# Patient Record
Sex: Female | Born: 1955 | Race: Black or African American | Hispanic: No | Marital: Married | Smoking: Current every day smoker
Health system: Southern US, Community
[De-identification: ages and names within clinical notes are randomized; demographics above are authoritative.]

## PROBLEM LIST (undated history)

## (undated) DIAGNOSIS — I10 Essential (primary) hypertension: Secondary | ICD-10-CM

## (undated) DIAGNOSIS — K219 Gastro-esophageal reflux disease without esophagitis: Secondary | ICD-10-CM

## (undated) DIAGNOSIS — G8929 Other chronic pain: Secondary | ICD-10-CM

## (undated) DIAGNOSIS — E7800 Pure hypercholesterolemia, unspecified: Secondary | ICD-10-CM

## (undated) HISTORY — PX: ABDOMINAL HYSTERECTOMY: SHX81

## (undated) HISTORY — PX: WRIST SURGERY: SHX841

---

## 2012-01-02 ENCOUNTER — Emergency Department (HOSPITAL_BASED_OUTPATIENT_CLINIC_OR_DEPARTMENT_OTHER): Payer: Medicare PPO

## 2012-01-02 ENCOUNTER — Emergency Department (HOSPITAL_BASED_OUTPATIENT_CLINIC_OR_DEPARTMENT_OTHER)
Admission: EM | Admit: 2012-01-02 | Discharge: 2012-01-02 | Disposition: A | Discharge status: Home / Self Care | Payer: Medicare PPO | Attending: Emergency Medicine | Admitting: Emergency Medicine

## 2012-01-02 ENCOUNTER — Encounter (HOSPITAL_BASED_OUTPATIENT_CLINIC_OR_DEPARTMENT_OTHER): Payer: Self-pay | Admitting: *Deleted

## 2012-01-02 DIAGNOSIS — IMO0001 Reserved for inherently not codable concepts without codable children: Secondary | ICD-10-CM | POA: Insufficient documentation

## 2012-01-02 DIAGNOSIS — I10 Essential (primary) hypertension: Secondary | ICD-10-CM | POA: Insufficient documentation

## 2012-01-02 DIAGNOSIS — E7800 Pure hypercholesterolemia, unspecified: Secondary | ICD-10-CM | POA: Insufficient documentation

## 2012-01-02 DIAGNOSIS — M6580 Other synovitis and tenosynovitis, unspecified site: Secondary | ICD-10-CM | POA: Insufficient documentation

## 2012-01-02 DIAGNOSIS — F17200 Nicotine dependence, unspecified, uncomplicated: Secondary | ICD-10-CM | POA: Insufficient documentation

## 2012-01-02 DIAGNOSIS — M778 Other enthesopathies, not elsewhere classified: Secondary | ICD-10-CM

## 2012-01-02 HISTORY — DX: Essential (primary) hypertension: I10

## 2012-01-02 HISTORY — DX: Pure hypercholesterolemia, unspecified: E78.00

## 2012-01-02 MED ORDER — HYDROCODONE-ACETAMINOPHEN 5-325 MG PO TABS: 2.0000 | ORAL_TABLET | ORAL | Status: DC | PRN

## 2012-01-02 MED ORDER — HYDROCODONE-ACETAMINOPHEN 5-325 MG PO TABS
2.0000 | ORAL_TABLET | Freq: Once | ORAL | Status: AC
  Administered 2012-01-02: 2 via ORAL
  Filled 2012-01-02: qty 2

## 2012-01-02 NOTE — ED Notes (Signed)
Right wrist and shoulder pain x 3 days.  She has been taking Arthritis Tylenol without relief.  No injury.

## 2012-01-02 NOTE — ED Provider Notes (Signed)
History     CSN: [id number]  Arrival date & time 01/02/12  1924   First MD Initiated Contact with Patient 01/02/12 1947      Chief Complaint  Patient presents with  . Wrist Pain    (Consider location/radiation/quality/duration/timing/severity/associated sxs/prior treatment) Patient is a 56 y.o. female presenting with wrist pain. The history is provided by the patient. No language interpreter was used.  Wrist Pain This is a new problem. Episode onset: 3 days. The problem occurs constantly. The problem has been gradually worsening. Associated symptoms include myalgias. Pertinent negatives include no joint swelling. Associated symptoms comments: Elbow pain that shots to shoulder and wrist. The symptoms are aggravated by bending. She has tried acetaminophen for the symptoms. The treatment provided no relief.   Pt denies any injury.   Pt complains of pain with moving Past Medical History  Diagnosis Date  . Hypertension   . High cholesterol     Past Surgical History  Procedure Date  . Wrist surgery     No family history on file.  History  Substance Use Topics  . Smoking status: Current Every Day Smoker -- 0.5 packs/day    Types: Cigarettes  . Smokeless tobacco: Not on file  . Alcohol Use: No    OB History    Grav Para Term Preterm Abortions TAB SAB Ect Mult Living                  Review of Systems  Musculoskeletal: Positive for myalgias. Negative for joint swelling.  All other systems reviewed and are negative.    Allergies  Review of patient's allergies indicates no known allergies.  Home Medications   Current Outpatient Rx  Name  Route  Sig  Dispense  Refill  . GABAPENTIN PO   Oral   Take by mouth.         Marland Kitchen HYDROCODONE-ACETAMINOPHEN 5-325 MG PO TABS   Oral   Take 1 tablet by mouth every 6 (six) hours as needed.         Marland Kitchen SIMVASTATIN 40 MG PO TABS   Oral   Take 40 mg by mouth every evening.           BP 138/66  Pulse 80  Temp 98.6 F (37  C) (Oral)  Resp 20  SpO2 99%  Physical Exam  Nursing note and vitals reviewed. Constitutional: She is oriented to person, place, and time. She appears well-developed and well-nourished.  Cardiovascular: Normal rate.   Pulmonary/Chest: Effort normal.  Musculoskeletal: She exhibits tenderness.       Pain with range of motion right elbow, right shoulder and right wrist  Neurological: She is alert and oriented to person, place, and time. She has normal reflexes.  Skin: Skin is warm.  Psychiatric: She has a normal mood and affect.    ED Course  Procedures (including critical care time)  Labs Reviewed - No data to display No results found.   1. Tendonitis of elbow, right       MDM   Followup with Dr. Pearletha Forge for recheck in one week patient is given prescription for hydrocodone for pain she is advised to limit use of right elbow patient is placed in a sling.       Lonia Skinner [place], [place] 01/02/12 2105

## 2012-01-03 NOTE — ED Provider Notes (Signed)
Medical screening examination/treatment/procedure(s) were performed by non-physician practitioner and as supervising physician I was immediately available for consultation/collaboration.   Glynn Octave, MD 01/03/12 [phone]

## 2012-05-08 ENCOUNTER — Encounter (HOSPITAL_BASED_OUTPATIENT_CLINIC_OR_DEPARTMENT_OTHER): Payer: Self-pay | Admitting: *Deleted

## 2012-05-08 ENCOUNTER — Emergency Department (HOSPITAL_BASED_OUTPATIENT_CLINIC_OR_DEPARTMENT_OTHER)
Admission: EM | Admit: 2012-05-08 | Discharge: 2012-05-08 | Disposition: A | Discharge status: Home / Self Care | Payer: Medicare PPO | Attending: Emergency Medicine | Admitting: Emergency Medicine

## 2012-05-08 DIAGNOSIS — Z79899 Other long term (current) drug therapy: Secondary | ICD-10-CM | POA: Insufficient documentation

## 2012-05-08 DIAGNOSIS — E7800 Pure hypercholesterolemia, unspecified: Secondary | ICD-10-CM | POA: Insufficient documentation

## 2012-05-08 DIAGNOSIS — F17200 Nicotine dependence, unspecified, uncomplicated: Secondary | ICD-10-CM | POA: Insufficient documentation

## 2012-05-08 DIAGNOSIS — L0390 Cellulitis, unspecified: Secondary | ICD-10-CM

## 2012-05-08 DIAGNOSIS — I10 Essential (primary) hypertension: Secondary | ICD-10-CM | POA: Insufficient documentation

## 2012-05-08 DIAGNOSIS — N63 Unspecified lump in unspecified breast: Secondary | ICD-10-CM | POA: Insufficient documentation

## 2012-05-08 DIAGNOSIS — B372 Candidiasis of skin and nail: Secondary | ICD-10-CM | POA: Insufficient documentation

## 2012-05-08 MED ORDER — CEPHALEXIN 500 MG PO CAPS: 500.0000 mg | ORAL_CAPSULE | Freq: Four times a day (QID) | ORAL | Status: DC

## 2012-05-08 MED ORDER — NYSTATIN 100000 UNIT/GM EX POWD: Freq: Four times a day (QID) | CUTANEOUS | Status: DC

## 2012-05-08 NOTE — ED Provider Notes (Signed)
History     CSN: [id number]  Arrival date & time 05/08/12  1947   First MD Initiated Contact with Patient 05/08/12 2125      Chief Complaint  Patient presents with  . Rash    (Consider location/radiation/quality/duration/timing/severity/associated sxs/prior treatment) Patient is a 57 y.o. female presenting with rash. The history is provided by the patient.  Rash Location: under breasts and right thigh. Quality: itchiness and swelling   Severity:  Moderate Onset quality:  Gradual Duration:  2 days Timing:  Constant Progression:  Worsening Chronicity:  New   Past Medical History  Diagnosis Date  . Hypertension   . High cholesterol     Past Surgical History  Procedure Laterality Date  . Wrist surgery      No family history on file.  History  Substance Use Topics  . Smoking status: Current Every Day Smoker -- 0.50 packs/day    Types: Cigarettes  . Smokeless tobacco: Not on file  . Alcohol Use: No    OB History   Grav Para Term Preterm Abortions TAB SAB Ect Mult Living                  Review of Systems  Skin: Positive for rash.  All other systems reviewed and are negative.    Allergies  Review of patient's allergies indicates no known allergies.  Home Medications   Current Outpatient Rx  Name  Route  Sig  Dispense  Refill  . GABAPENTIN PO   Oral   Take by mouth.         Marland Kitchen HYDROcodone-acetaminophen (NORCO/VICODIN) 5-325 MG per tablet   Oral   Take 1 tablet by mouth every 6 (six) hours as needed.         Marland Kitchen HYDROcodone-acetaminophen (NORCO/VICODIN) 5-325 MG per tablet   Oral   Take 2 tablets by mouth every 4 (four) hours as needed for pain.   10 tablet   0   . simvastatin (ZOCOR) 40 MG tablet   Oral   Take 40 mg by mouth every evening.           BP 131/78  Pulse 100  Temp(Src) 98.2 F (36.8 C) (Oral)  Resp 20  Wt 159 lb (72.122 kg)  SpO2 98%  Physical Exam  Nursing note and vitals reviewed. Constitutional: She is oriented  to person, place, and time. She appears well-developed and well-nourished. No distress.  HENT:  Head: Normocephalic and atraumatic.  Mouth/Throat: Oropharynx is clear and moist.  Neck: Normal range of motion. Neck supple.  Neurological: She is alert and oriented to person, place, and time.  Skin: Skin is warm and dry. She is not diaphoretic.  There is what appears to be a candidal infection under the breasts.  There is also a larger 10 cm round area to the right lateral thigh that is erythematous, warm to the touch.      ED Course  Procedures (including critical care time)  Labs Reviewed - No data to display No results found.   No diagnosis found.    MDM  The breast rash appears to be candidal in nature but the thigh looks like cellulitis.  Will treat the former with nystatin and the latter with keflex.  To return prn.        Geoffery Lyons, MD 05/08/12 2136

## 2012-05-08 NOTE — ED Notes (Addendum)
Blistery rash under both breast and large swollen, painful area to her right thigh x 2 weeks. No relief with Benadryl. Started after sitting on a tree her sister had just cut down.

## 2013-11-24 ENCOUNTER — Emergency Department (HOSPITAL_BASED_OUTPATIENT_CLINIC_OR_DEPARTMENT_OTHER)
Admission: EM | Admit: 2013-11-24 | Discharge: 2013-11-24 | Disposition: A | Discharge status: Home / Self Care | Payer: Medicare HMO | Attending: Emergency Medicine | Admitting: Emergency Medicine

## 2013-11-24 ENCOUNTER — Emergency Department (HOSPITAL_BASED_OUTPATIENT_CLINIC_OR_DEPARTMENT_OTHER): Payer: Medicare HMO

## 2013-11-24 ENCOUNTER — Encounter (HOSPITAL_BASED_OUTPATIENT_CLINIC_OR_DEPARTMENT_OTHER): Payer: Self-pay | Admitting: Emergency Medicine

## 2013-11-24 DIAGNOSIS — S8992XA Unspecified injury of left lower leg, initial encounter: Secondary | ICD-10-CM | POA: Diagnosis present

## 2013-11-24 DIAGNOSIS — M25562 Pain in left knee: Secondary | ICD-10-CM

## 2013-11-24 DIAGNOSIS — Y9289 Other specified places as the place of occurrence of the external cause: Secondary | ICD-10-CM | POA: Diagnosis not present

## 2013-11-24 DIAGNOSIS — E780 Pure hypercholesterolemia: Secondary | ICD-10-CM | POA: Insufficient documentation

## 2013-11-24 DIAGNOSIS — Z720 Tobacco use: Secondary | ICD-10-CM | POA: Insufficient documentation

## 2013-11-24 DIAGNOSIS — W010XXA Fall on same level from slipping, tripping and stumbling without subsequent striking against object, initial encounter: Secondary | ICD-10-CM | POA: Insufficient documentation

## 2013-11-24 DIAGNOSIS — S59902A Unspecified injury of left elbow, initial encounter: Secondary | ICD-10-CM | POA: Diagnosis not present

## 2013-11-24 DIAGNOSIS — Y9389 Activity, other specified: Secondary | ICD-10-CM | POA: Insufficient documentation

## 2013-11-24 DIAGNOSIS — Z792 Long term (current) use of antibiotics: Secondary | ICD-10-CM | POA: Diagnosis not present

## 2013-11-24 DIAGNOSIS — Z79899 Other long term (current) drug therapy: Secondary | ICD-10-CM | POA: Diagnosis not present

## 2013-11-24 DIAGNOSIS — M25522 Pain in left elbow: Secondary | ICD-10-CM

## 2013-11-24 DIAGNOSIS — I10 Essential (primary) hypertension: Secondary | ICD-10-CM | POA: Insufficient documentation

## 2013-11-24 MED ORDER — IBUPROFEN 800 MG PO TABS
800.0000 mg | ORAL_TABLET | Freq: Once | ORAL | Status: AC
  Administered 2013-11-24: 800 mg via ORAL
  Filled 2013-11-24: qty 1

## 2013-11-24 MED ORDER — IBUPROFEN 800 MG PO TABS: 800.0000 mg | ORAL_TABLET | Freq: Three times a day (TID) | ORAL | Status: DC

## 2013-11-24 NOTE — ED Notes (Signed)
Patient fell appx an hour ago on cement floor, now c/o left knee pain and left elbow pain

## 2013-11-24 NOTE — ED Provider Notes (Signed)
CSN: [id number]     Arrival date & time 11/24/13  1737 History   First MD Initiated Contact with Patient 11/24/13 1753     Chief Complaint  Patient presents with  . Fall     (Consider location/radiation/quality/duration/timing/severity/associated sxs/prior Treatment) HPI Comments: This is a 58 year old female who presents to the emergency department complaining of left knee pain and left elbow pain after a mechanical fall occurring about 2 hours prior to arrival. Patient reports the cement floor was uneven causing her to trip and fall, she reports she tried to break her fall with her elbow and knee. No head injury or loss of consciousness. States her knee pain is worse than her elbow pain, worse with certain movements. She has not had any alleviating factors. Denies any radiating pain. No numbness or tingling.  Patient is a 58 y.o. female presenting with fall. The history is provided by the patient and a relative.  Fall    Past Medical History  Diagnosis Date  . Hypertension   . High cholesterol    Past Surgical History  Procedure Laterality Date  . Wrist surgery     No family history on file. History  Substance Use Topics  . Smoking status: Current Every Day Smoker -- 0.50 packs/day    Types: Cigarettes  . Smokeless tobacco: Not on file  . Alcohol Use: No   OB History   Grav Para Term Preterm Abortions TAB SAB Ect Mult Living                 Review of Systems  10 Systems reviewed and are negative for acute change except as noted in the HPI.   Allergies  Review of patient's allergies indicates no known allergies.  Home Medications   Prior to Admission medications   Medication Sig Start Date End Date Taking? Authorizing Provider  losartan-hydrochlorothiazide (HYZAAR) 100-12.5 MG per tablet Take 1 tablet by mouth daily.   Yes Historical Provider, MD  cephALEXin (KEFLEX) 500 MG capsule Take 1 capsule (500 mg total) by mouth 4 (four) times daily. 05/08/12   Geoffery Lyonsouglas  Delo, MD  GABAPENTIN PO Take by mouth.    Historical Provider, MD  HYDROcodone-acetaminophen (NORCO/VICODIN) 5-325 MG per tablet Take 1 tablet by mouth every 6 (six) hours as needed.    Historical Provider, MD  HYDROcodone-acetaminophen (NORCO/VICODIN) 5-325 MG per tablet Take 2 tablets by mouth every 4 (four) hours as needed for pain. 01/02/12   Elson AreasLeslie K Sofia, PA-C  ibuprofen (ADVIL,MOTRIN) 800 MG tablet Take 1 tablet (800 mg total) by mouth 3 (three) times daily. 11/24/13   Dejanae Helser M Jahmya Onofrio, PA-C  nystatin (MYCOSTATIN) powder Apply topically 4 (four) times daily. 05/08/12   Geoffery Lyonsouglas Delo, MD  simvastatin (ZOCOR) 40 MG tablet Take 40 mg by mouth every evening.    Historical Provider, MD   BP 156/90  Pulse 69  Temp(Src) 98.3 F (36.8 C) (Oral)  Resp 18  Ht 5' (1.524 m)  Wt 155 lb (70.308 kg)  BMI 30.27 kg/m2  SpO2 95% Physical Exam  Nursing note and vitals reviewed. Constitutional: She is oriented to person, place, and time. She appears well-developed and well-nourished. No distress.  HENT:  Head: Normocephalic and atraumatic.  Mouth/Throat: Oropharynx is clear and moist.  Eyes: Conjunctivae and EOM are normal.  Neck: Normal range of motion. Neck supple.  Cardiovascular: Normal rate, regular rhythm, normal heart sounds and intact distal pulses.   +2 DP/PT pulse on left. +2 radial pulse on left. Refill less  than 3 seconds.  Pulmonary/Chest: Effort normal and breath sounds normal. No respiratory distress.  Musculoskeletal: Normal range of motion. She exhibits no edema.  L knee TTP over patella. No swelling, bruising or deformity. FROM. L elbow TTP over medial epicondyle. No swelling, bruising or deformity. Full flexion, extension, supination and pronation.  Neurological: She is alert and oriented to person, place, and time. No sensory deficit.  Sensation intact.  Skin: Skin is warm and dry.  Psychiatric: She has a normal mood and affect. Her behavior is normal.    ED Course  Procedures  (including critical care time) Labs Review Labs Reviewed - No data to display  Imaging Review Dg Elbow Complete Left  11/24/2013   CLINICAL DATA:  58 year old female with left elbow pain  EXAM: LEFT ELBOW - COMPLETE 3+ VIEW  COMPARISON:  None.  FINDINGS: No acute bony abnormality, although pronated view demonstrates slight angulation at the radial head on the ulnar aspect. No significant soft tissue swelling. No radiopaque foreign body. No joint effusion. Elbow joint is congruent.  IMPRESSION: No acute displaced fracture identified, however, there is angulation on the ulnar aspect at the radial head, which could potentially represent a nondisplaced fracture. If there is point tenderness and clinical concern for acute fracture, repeat plain films in 10-14 days may be useful.  Signed,  Yvone NeuJaime S. Loreta AveWagner, DO  [facility]  [facility]   Electronically Signed   By: Gilmer MorJaime  Wagner D.O.   On: 11/24/2013 18:47   Dg Knee Complete 4 Views Left  11/24/2013   CLINICAL DATA:  58 year old female status post fall onto cement floor with acute knee pain. Initial encounter.  EXAM: LEFT KNEE - COMPLETE 4+ VIEW  COMPARISON:  None.  FINDINGS: Bone mineralization is within normal limits. No joint effusion identified. Patella intact. Mild medial compartment joint space loss and degenerative spurring. No acute fracture or dislocation identified. Calcified atherosclerosis intermittently noted in the visible left lower extremity.  IMPRESSION: No acute fracture or dislocation identified about the left knee.   Electronically Signed   By: Augusto GambleLee  Hall M.D.   On: 11/24/2013 18:44     EKG Interpretation None      MDM   Final diagnoses:  Fall from slip, trip, or stumble, initial encounter  Left knee pain  Left elbow pain   Patient presenting after a mechanical fall. She is nontoxic appearing and in no apparent distress. Neurovascularly intact. Left knee x-ray normal. Left elbow  x-ray with results as shown above. After receiving ibuprofen, she is moving her elbow around without any pain. Doubt fracture. Sling given. Ace wrap to knee. Advised her to followup with her PCP in one week for recheck.  RICE, NSAIDs. Stable for d/c. Ambulates without difficulty. Return precautions given. Patient states understanding of treatment care plan and is agreeable.    Kathrynn SpeedRobyn M Koren Sermersheim, PA-C 11/24/13 [phone]

## 2013-11-24 NOTE — Discharge Instructions (Signed)
Take ibuprofen as directed. Followup with your doctor in one week for recheck.  Knee Pain The knee is the complex joint between your thigh and your lower leg. It is made up of bones, tendons, ligaments, and cartilage. The bones that make up the knee are:  The femur in the thigh.  The tibia and fibula in the lower leg.  The patella or kneecap riding in the groove on the lower femur. CAUSES  Knee pain is a common complaint with many causes. A few of these causes are:  Injury, such as:  A ruptured ligament or tendon injury.  Torn cartilage.  Medical conditions, such as:  Gout  Arthritis  Infections  Overuse, over training, or overdoing a physical activity. Knee pain can be minor or severe. Knee pain can accompany debilitating injury. Minor knee problems often respond well to self-care measures or get well on their own. More serious injuries may need medical intervention or even surgery. SYMPTOMS The knee is complex. Symptoms of knee problems can vary widely. Some of the problems are:  Pain with movement and weight bearing.  Swelling and tenderness.  Buckling of the knee.  Inability to straighten or extend your knee.  Your knee locks and you cannot straighten it.  Warmth and redness with pain and fever.  Deformity or dislocation of the kneecap. DIAGNOSIS  Determining what is wrong may be very straight forward such as when there is an injury. It can also be challenging because of the complexity of the knee. Tests to make a diagnosis may include:  Your caregiver taking a history and doing a physical exam.  Routine X-rays can be used to rule out other problems. X-rays will not reveal a cartilage tear. Some injuries of the knee can be diagnosed by:  Arthroscopy a surgical technique by which a small video camera is inserted through tiny incisions on the sides of the knee. This procedure is used to examine and repair internal knee joint problems. Tiny instruments can be used  during arthroscopy to repair the torn knee cartilage (meniscus).  Arthrography is a radiology technique. A contrast liquid is directly injected into the knee joint. Internal structures of the knee joint then become visible on X-ray film.  An MRI scan is a non X-ray radiology procedure in which magnetic fields and a computer produce two- or three-dimensional images of the inside of the knee. Cartilage tears are often visible using an MRI scanner. MRI scans have largely replaced arthrography in diagnosing cartilage tears of the knee.  Blood work.  Examination of the fluid that helps to lubricate the knee joint (synovial fluid). This is done by taking a sample out using a needle and a syringe. TREATMENT The treatment of knee problems depends on the cause. Some of these treatments are:  Depending on the injury, proper casting, splinting, surgery, or physical therapy care will be needed.  Give yourself adequate recovery time. Do not overuse your joints. If you begin to get sore during workout routines, back off. Slow down or do fewer repetitions.  For repetitive activities such as cycling or running, maintain your strength and nutrition.  Alternate muscle groups. For example, if you are a weight lifter, work the upper body on one day and the lower body the next.  Either tight or weak muscles do not give the proper support for your knee. Tight or weak muscles do not absorb the stress placed on the knee joint. Keep the muscles surrounding the knee strong.  Take care of  mechanical problems.  If you have flat feet, orthotics or special shoes may help. See your caregiver if you need help.  Arch supports, sometimes with wedges on the inner or outer aspect of the heel, can help. These can shift pressure away from the side of the knee most bothered by osteoarthritis.  A brace called an "unloader" brace also may be used to help ease the pressure on the most arthritic side of the knee.  If your  caregiver has prescribed crutches, braces, wraps or ice, use as directed. The acronym for this is PRICE. This means protection, rest, ice, compression, and elevation.  Nonsteroidal anti-inflammatory drugs (NSAIDs), can help relieve pain. But if taken immediately after an injury, they may actually increase swelling. Take NSAIDs with food in your stomach. Stop them if you develop stomach problems. Do not take these if you have a history of ulcers, stomach pain, or bleeding from the bowel. Do not take without your caregiver's approval if you have problems with fluid retention, heart failure, or kidney problems.  For ongoing knee problems, physical therapy may be helpful.  Glucosamine and chondroitin are over-the-counter dietary supplements. Both may help relieve the pain of osteoarthritis in the knee. These medicines are different from the usual anti-inflammatory drugs. Glucosamine may decrease the rate of cartilage destruction.  Injections of a corticosteroid drug into your knee joint may help reduce the symptoms of an arthritis flare-up. They may provide pain relief that lasts a few months. You may have to wait a few months between injections. The injections do have a small increased risk of infection, water retention, and elevated blood sugar levels.  Hyaluronic acid injected into damaged joints may ease pain and provide lubrication. These injections may work by reducing inflammation. A series of shots may give relief for as long as 6 months.  Topical painkillers. Applying certain ointments to your skin may help relieve the pain and stiffness of osteoarthritis. Ask your pharmacist for suggestions. Many over the-counter products are approved for temporary relief of arthritis pain.  In some countries, doctors often prescribe topical NSAIDs for relief of chronic conditions such as arthritis and tendinitis. A review of treatment with NSAID creams found that they worked as well as oral medications but  without the serious side effects. PREVENTION  Maintain a healthy weight. Extra pounds put more strain on your joints.  Get strong, stay limber. Weak muscles are a common cause of knee injuries. Stretching is important. Include flexibility exercises in your workouts.  Be smart about exercise. If you have osteoarthritis, chronic knee pain or recurring injuries, you may need to change the way you exercise. This does not mean you have to stop being active. If your knees ache after jogging or playing basketball, consider switching to swimming, water aerobics, or other low-impact activities, at least for a few days a week. Sometimes limiting high-impact activities will provide relief.  Make sure your shoes fit well. Choose footwear that is right for your sport.  Protect your knees. Use the proper gear for knee-sensitive activities. Use kneepads when playing volleyball or laying carpet. Buckle your seat belt every time you drive. Most shattered kneecaps occur in car accidents.  Rest when you are tired. SEEK MEDICAL CARE IF:  You have knee pain that is continual and does not seem to be getting better.  SEEK IMMEDIATE MEDICAL CARE IF:  Your knee joint feels hot to the touch and you have a high fever. MAKE SURE YOU:   Understand these instructions.  Will watch your condition.  Will get help right away if you are not doing well or get worse. Document Released: 11/21/2006 Document Revised: 04/18/2011 Document Reviewed: 11/21/2006 [facility] Patient Information 2015 [place], [place]. This information is not intended to replace advice given to you by your health care provider. Make sure you discuss any questions you have with your health care provider. Elbow Contusion An elbow contusion is a deep bruise of the elbow. Contusions are the result of an injury that caused bleeding under the skin. The contusion may turn blue, purple, or yellow. Minor injuries will give you a painless contusion, but more severe  contusions may stay painful and swollen for a few weeks.  CAUSES  An elbow contusion comes from a direct force to that area, such as falling on the elbow. SYMPTOMS   Swelling and redness of the elbow.  Bruising of the elbow area.  Tenderness or soreness of the elbow. DIAGNOSIS  You will have a physical exam and will be asked about your history. You may need an X-ray of your elbow to look for a broken bone (fracture).  TREATMENT  A sling or splint may be needed to support your injury. Resting, elevating, and applying cold compresses to the elbow area are often the best treatments for an elbow contusion. Over-the-counter medicines may also be recommended for pain control. HOME CARE INSTRUCTIONS   Put ice on the injured area.  Put ice in a plastic bag.  Place a towel between your skin and the bag.  Leave the ice on for 15-20 minutes, 03-04 times a day.  Only take over-the-counter or prescription medicines for pain, discomfort, or fever as directed by your caregiver.  Rest your injured elbow until the pain and swelling are better.  Elevate your elbow to reduce swelling.  Apply a compression wrap as directed by your caregiver. This can help reduce swelling and motion. You may remove the wrap for sleeping, showers, and baths. If your fingers become numb, cold, or blue, take the wrap off and reapply it more loosely.  Use your elbow only as directed by your caregiver. You may be asked to do range of motion exercises. Do them as directed.  See your caregiver as directed. It is very important to keep all follow-up appointments in order to avoid any long-term problems with your elbow, including chronic pain or inability to move your elbow normally. SEEK IMMEDIATE MEDICAL CARE IF:   You have increased redness, swelling, or pain in your elbow.  Your swelling or pain is not relieved with medicines.  You have swelling of the hand and fingers.  You are unable to move your fingers or  wrist.  You begin to lose feeling in your hand or fingers.  Your fingers or hand become cold or blue. MAKE SURE YOU:   Understand these instructions.  Will watch your condition.  Will get help right away if you are not doing well or get worse. Document Released: 01/02/2006 Document Revised: 04/18/2011 Document Reviewed: 12/10/2010 [facility] Patient Information 2015 [place], [place]. This information is not intended to replace advice given to you by your health care provider. Make sure you discuss any questions you have with your health care provider. RICE: Routine Care for Injuries The routine care of many injuries includes Rest, Ice, Compression, and Elevation (RICE). HOME CARE INSTRUCTIONS  Rest is needed to allow your body to heal. Routine activities can usually be resumed when comfortable. Injured tendons and bones can take up to 6 weeks to  heal. Tendons are the cord-like structures that attach muscle to bone.  Ice following an injury helps keep the swelling down and reduces pain.  Put ice in a plastic bag.  Place a towel between your skin and the bag.  Leave the ice on for 15-20 minutes, 3-4 times a day, or as directed by your health care provider. Do this while awake, for the first 24 to 48 hours. After that, continue as directed by your caregiver.  Compression helps keep swelling down. It also gives support and helps with discomfort. If an elastic bandage has been applied, it should be removed and reapplied every 3 to 4 hours. It should not be applied tightly, but firmly enough to keep swelling down. Watch fingers or toes for swelling, bluish discoloration, coldness, numbness, or excessive pain. If any of these problems occur, remove the bandage and reapply loosely. Contact your caregiver if these problems continue.  Elevation helps reduce swelling and decreases pain. With extremities, such as the arms, hands, legs, and feet, the injured area should be placed near or above the  level of the heart, if possible. SEEK IMMEDIATE MEDICAL CARE IF:  You have persistent pain and swelling.  You develop redness, numbness, or unexpected weakness.  Your symptoms are getting worse rather than improving after several days. These symptoms may indicate that further evaluation or further X-rays are needed. Sometimes, X-rays may not show a small broken bone (fracture) until 1 week or 10 days later. Make a follow-up appointment with your caregiver. Ask when your X-ray results will be ready. Make sure you get your X-ray results. Document Released: 05/08/2000 Document Revised: 01/29/2013 Document Reviewed: 06/25/2010 [facility] Patient Information 2015 [place], [place]. This information is not intended to replace advice given to you by your health care provider. Make sure you discuss any questions you have with your health care provider.

## 2013-11-25 NOTE — ED Provider Notes (Signed)
Medical screening examination/treatment/procedure(s) were performed by non-physician practitioner and as supervising physician I was immediately available for consultation/collaboration.   EKG Interpretation None       Deletha Jaffee, MD 11/25/13 0001 

## 2015-07-26 ENCOUNTER — Encounter (HOSPITAL_BASED_OUTPATIENT_CLINIC_OR_DEPARTMENT_OTHER): Payer: Self-pay | Admitting: Emergency Medicine

## 2015-07-26 ENCOUNTER — Emergency Department (HOSPITAL_BASED_OUTPATIENT_CLINIC_OR_DEPARTMENT_OTHER): Payer: Medicare HMO

## 2015-07-26 ENCOUNTER — Emergency Department (HOSPITAL_BASED_OUTPATIENT_CLINIC_OR_DEPARTMENT_OTHER)
Admission: EM | Admit: 2015-07-26 | Discharge: 2015-07-26 | Disposition: A | Discharge status: Home / Self Care | Payer: Medicare HMO | Attending: Emergency Medicine | Admitting: Emergency Medicine

## 2015-07-26 DIAGNOSIS — Z79899 Other long term (current) drug therapy: Secondary | ICD-10-CM | POA: Diagnosis not present

## 2015-07-26 DIAGNOSIS — F17210 Nicotine dependence, cigarettes, uncomplicated: Secondary | ICD-10-CM | POA: Insufficient documentation

## 2015-07-26 DIAGNOSIS — M79606 Pain in leg, unspecified: Secondary | ICD-10-CM

## 2015-07-26 DIAGNOSIS — M79605 Pain in left leg: Secondary | ICD-10-CM | POA: Diagnosis present

## 2015-07-26 DIAGNOSIS — I10 Essential (primary) hypertension: Secondary | ICD-10-CM | POA: Diagnosis not present

## 2015-07-26 HISTORY — DX: Gastro-esophageal reflux disease without esophagitis: K21.9

## 2015-07-26 HISTORY — DX: Pure hypercholesterolemia, unspecified: E78.00

## 2015-07-26 HISTORY — DX: Other chronic pain: G89.29

## 2015-07-26 LAB — CBC
HEMATOCRIT: 39.6 % (ref 36.0–46.0)
HEMOGLOBIN: 13 g/dL (ref 12.0–15.0)
MCH: 31.3 pg (ref 26.0–34.0)
MCHC: 32.8 g/dL (ref 30.0–36.0)
MCV: 95.2 fL (ref 78.0–100.0)
Platelets: 214 10*3/uL (ref 150–400)
RBC: 4.16 MIL/uL (ref 3.87–5.11)
RDW: 12.8 % (ref 11.5–15.5)
WBC: 5.1 10*3/uL (ref 4.0–10.5)

## 2015-07-26 LAB — BASIC METABOLIC PANEL
ANION GAP: 9 (ref 5–15)
BUN: 17 mg/dL (ref 6–20)
CALCIUM: 8.9 mg/dL (ref 8.9–10.3)
CO2: 26 mmol/L (ref 22–32)
Chloride: 103 mmol/L (ref 101–111)
Creatinine, Ser: 1.01 mg/dL — ABNORMAL HIGH (ref 0.44–1.00)
GFR calc Af Amer: >60 mL/min (ref >60)
GLUCOSE: 108 mg/dL — AB (ref 65–99)
Potassium: 3.4 mmol/L — ABNORMAL LOW (ref 3.5–5.1)
Sodium: 138 mmol/L (ref 135–145)

## 2015-07-26 MED ORDER — NAPROXEN 500 MG PO TABS: 500.0000 mg | ORAL_TABLET | Freq: Two times a day (BID) | ORAL | Status: DC

## 2015-07-26 NOTE — ED Notes (Signed)
MD at bedside. 

## 2015-07-26 NOTE — ED Notes (Addendum)
Patient was sent her from [facility] to be seen for a r/o DVT - left lower leg pain x 1 week. Patient was given 60 mg of IM tordal at the Urgent Care

## 2015-07-26 NOTE — Discharge Instructions (Signed)

## 2015-07-26 NOTE — ED Provider Notes (Signed)
CSN: [id number]     Arrival date & time 07/26/15  1348 History  By signing my name below, I, Ruth Miller, attest that this documentation has been prepared under the direction and in the presence of Linwood Dibbles, MD . Electronically Signed: Marisue Miller, Scribe. 07/26/2015. 3:40 PM.   Chief Complaint  Patient presents with  . Leg Pain    The history is provided by the patient. No language interpreter was used.   HPI Comments:  Ruth Miller is a 60 y.o. female with PMHx of HTN and HLD who presents to the Emergency Department complaining of gradual onset, moderate left lower leg pain for the past week. Pain worsens with palpation and ambulation. Pt reports associated sudden onset LLE swelling this morning. No alleviating factors noted. Pt was referred from urgent care to r/o DVT. Denies fever, chills, recent injury or fall, chest pain, abdominal pain, back pain, or h/o similar pain.  Past Medical History  Diagnosis Date  . Hypertension   . High cholesterol   . Chronic pain   . Hypercholesteremia   . GERD (gastroesophageal reflux disease)    Past Surgical History  Procedure Laterality Date  . Wrist surgery    . Abdominal hysterectomy     History reviewed. No pertinent family history. Social History  Substance Use Topics  . Smoking status: Current Every Day Smoker -- 0.50 packs/day    Types: Cigarettes  . Smokeless tobacco: None  . Alcohol Use: No   OB History    No data available     Review of Systems  Cardiovascular: Positive for leg swelling.  Musculoskeletal: Positive for myalgias and arthralgias.  All other systems reviewed and are negative.   Allergies  Review of patient's allergies indicates no known allergies.  Home Medications   Prior to Admission medications   Medication Sig Start Date End Date Taking? Authorizing Provider  cephALEXin (KEFLEX) 500 MG capsule Take 1 capsule (500 mg total) by mouth 4 (four) times daily. 05/08/12   Geoffery Lyons, MD  GABAPENTIN  PO Take by mouth.    Historical Provider, MD  HYDROcodone-acetaminophen (NORCO/VICODIN) 5-325 MG per tablet Take 1 tablet by mouth every 6 (six) hours as needed.    Historical Provider, MD  HYDROcodone-acetaminophen (NORCO/VICODIN) 5-325 MG per tablet Take 2 tablets by mouth every 4 (four) hours as needed for pain. 01/02/12   Elson Areas, PA-C  ibuprofen (ADVIL,MOTRIN) 800 MG tablet Take 1 tablet (800 mg total) by mouth 3 (three) times daily. 11/24/13   Robyn M Hess, PA-C  losartan-hydrochlorothiazide (HYZAAR) 100-12.5 MG per tablet Take 1 tablet by mouth daily.    Historical Provider, MD  nystatin (MYCOSTATIN) powder Apply topically 4 (four) times daily. 05/08/12   Geoffery Lyons, MD  simvastatin (ZOCOR) 40 MG tablet Take 40 mg by mouth every evening.    Historical Provider, MD   BP 113/71 mmHg  Pulse 52  Temp(Src) 98.7 F (37.1 C) (Oral)  Resp 18  Ht 5' (1.524 m)  Wt 157 lb (71.215 kg)  BMI 30.66 kg/m2  SpO2 98%   Physical Exam  Constitutional: She appears well-developed and well-nourished. No distress.  HENT:  Head: Normocephalic and atraumatic.  Right Ear: External ear normal.  Left Ear: External ear normal.  Eyes: Conjunctivae are normal. Right eye exhibits no discharge. Left eye exhibits no discharge. No scleral icterus.  Neck: Neck supple. No tracheal deviation present.  Cardiovascular: Normal rate.   Pulmonary/Chest: Effort normal. No stridor. No respiratory distress.  Musculoskeletal: She  exhibits no edema.       Left knee: She exhibits normal range of motion, no effusion and no erythema.       Left lower leg: She exhibits tenderness. She exhibits no swelling and no deformity.  TTP left popliteal fossa and left calf; strong DP pulses BL; no lumbar or paraspinal TTP  Neurological: She is alert. Cranial nerve deficit: no gross deficits.  Skin: Skin is warm and dry. No rash noted.  Psychiatric: She has a normal mood and affect.  Nursing note and vitals reviewed.   ED Course   Procedures  DIAGNOSTIC STUDIES:  Oxygen Saturation is 100% on RA, normal by my interpretation.    COORDINATION OF CARE:  3:20 PM Will order US of LLE, BMP, and CBC. Pt denies any pain medication at this time. Discussed treatment plan with pt at bedside and pt agreed to plan.  Labs Review Labs Reviewed  BASIC METABOLIC PANEL - Abnormal; Notable for the following:    Potassium 3.4 (*)    Glucose, Bld 108 (*)    Creatinine, Ser 1.01 (*)    All other components within normal limits  CBC    Imaging Review [place] Venous Img Lower Unilateral Left  07/26/2015  CLINICAL DATA:  LEFT calf pain for 1 week, swelling for 1 day. EXAM: LEFT LOWER EXTREMITY VENOUS DOPPLER ULTRASOUND TECHNIQUE: Gray-scale sonography with graded compression, as well as color Doppler and duplex ultrasound were performed to evaluate the lower extremity deep venous systems from the level of the common femoral vein and including the common femoral, femoral, profunda femoral, popliteal and calf veins including the posterior tibial, peroneal and gastrocnemius veins when visible. The superficial great saphenous vein was also interrogated. Spectral Doppler was utilized to evaluate flow at rest and with distal augmentation maneuvers in the common femoral, femoral and popliteal veins. COMPARISON:  None. FINDINGS: Contralateral Common Femoral Vein: Respiratory phasicity is normal and symmetric with the symptomatic side. No evidence of thrombus. Normal compressibility. Common Femoral Vein: No evidence of thrombus. Normal compressibility, respiratory phasicity and response to augmentation. Saphenofemoral Junction: No evidence of thrombus. Normal compressibility and flow on color Doppler imaging. Profunda Femoral Vein: No evidence of thrombus. Normal compressibility and flow on color Doppler imaging. Femoral Vein: No evidence of thrombus. Normal compressibility, respiratory phasicity and response to augmentation. Popliteal Vein: No evidence of  thrombus. Normal compressibility, respiratory phasicity and response to augmentation. Calf Veins: No evidence of thrombus. Normal compressibility and flow on color Doppler imaging. Superficial Great Saphenous Vein: No evidence of thrombus. Normal compressibility and flow on color Doppler imaging. Venous Reflux:  None. Other Findings:  None. IMPRESSION: No evidence of LEFT lower extremity deep venous thrombosis. Electronically Signed   By: Awilda Metroourtnay  Bloomer M.D.   On: 07/26/2015 17:21   I have personally reviewed and evaluated these images and lab results as part of my medical decision-making.    MDM   Final diagnoses:  Leg pain   No clear etiology for her pain.  No dvt.  Normal arterial pulse, doubt PVD.  No erythema, doubt cellulitis.  Pt does have muscular ttp but does not recall any injury.  Will dc home with nsaids.  Follow up with pcp this week  I personally performed the services described in this documentation, which was scribed in my presence.  The recorded information has been reviewed and is accurate.    Linwood DibblesJon River Mckercher, MD 07/26/15 [phone]

## 2016-01-23 ENCOUNTER — Emergency Department (HOSPITAL_BASED_OUTPATIENT_CLINIC_OR_DEPARTMENT_OTHER)
Admission: EM | Admit: 2016-01-23 | Discharge: 2016-01-23 | Disposition: A | Discharge status: Home / Self Care | Payer: Medicare HMO | Attending: Emergency Medicine | Admitting: Emergency Medicine

## 2016-01-23 ENCOUNTER — Encounter (HOSPITAL_BASED_OUTPATIENT_CLINIC_OR_DEPARTMENT_OTHER): Payer: Self-pay | Admitting: Emergency Medicine

## 2016-01-23 DIAGNOSIS — J029 Acute pharyngitis, unspecified: Secondary | ICD-10-CM | POA: Diagnosis present

## 2016-01-23 DIAGNOSIS — I10 Essential (primary) hypertension: Secondary | ICD-10-CM | POA: Insufficient documentation

## 2016-01-23 DIAGNOSIS — F17210 Nicotine dependence, cigarettes, uncomplicated: Secondary | ICD-10-CM | POA: Diagnosis not present

## 2016-01-23 DIAGNOSIS — Z79899 Other long term (current) drug therapy: Secondary | ICD-10-CM | POA: Diagnosis not present

## 2016-01-23 LAB — RAPID STREP SCREEN (MED CTR MEBANE ONLY): Streptococcus, Group A Screen (Direct): NEGATIVE

## 2016-01-23 MED ORDER — NAPROXEN 500 MG PO TABS: 500.0000 mg | ORAL_TABLET | Freq: Two times a day (BID) | ORAL | 0 refills | Status: DC

## 2016-01-23 MED ORDER — HYDROCODONE-ACETAMINOPHEN 5-325 MG PO TABS: 1.0000 | ORAL_TABLET | Freq: Four times a day (QID) | ORAL | 0 refills | Status: DC | PRN

## 2016-01-23 NOTE — ED Triage Notes (Signed)
Pt reports sinus congestion and sore throat for past several days.

## 2016-01-23 NOTE — ED Provider Notes (Signed)
[facility] Provider Note   CSN: [id number] Arrival date & time: 01/23/16  1346  By signing my name below, I, Nelwyn SalisburyJoshua Fowler, attest that this documentation has been prepared under the direction and in the presence of Vanetta MuldersScott Jak Haggar, MD . Electronically Signed: Nelwyn SalisburyJoshua Fowler, Scribe. 01/23/2016. 3:46 PM.  History   Chief Complaint Chief Complaint  Patient presents with  . Sore Throat   HPI HPI Comments:  Ruth Miller is a 60 y.o. female with pmhx of HTN, HLD, and GERD who presents to the Emergency Department complaining of intermittent gradually worsening sore throat beginning several days ago but worsening two days ago. She describes her symptoms as a pain that starts in her throat and radiates to her sinus and ears. No modifying factors indicated. She reports associated congestion and headache. Pt denies any chills, fever, visual disturbance, cough, shortness of breath, chest pain, abdominal pain, diarrhea, nausea, vomiting, dysuria, back pain, joint swelling, myalgias, bleeding problems, or confusion.  She also denies and sick contacts.  Past Medical History:  Diagnosis Date  . Chronic pain   . GERD (gastroesophageal reflux disease)   . High cholesterol   . Hypercholesteremia   . Hypertension     There are no active problems to display for this patient.   Past Surgical History:  Procedure Laterality Date  . ABDOMINAL HYSTERECTOMY    . WRIST SURGERY      OB History    No data available       Home Medications    Prior to Admission medications   Medication Sig Start Date End Date Taking? Authorizing Provider  GABAPENTIN PO Take by mouth.   Yes Historical Provider, MD  losartan-hydrochlorothiazide (HYZAAR) 100-12.5 MG per tablet Take 1 tablet by mouth daily.   Yes Historical Provider, MD  pantoprazole (PROTONIX) 40 MG tablet Take 40 mg by mouth daily.   Yes Historical Provider, MD  simvastatin (ZOCOR) 40 MG tablet Take 40 mg by mouth every evening.   Yes  Historical Provider, MD  cephALEXin (KEFLEX) 500 MG capsule Take 1 capsule (500 mg total) by mouth 4 (four) times daily. 05/08/12   Geoffery Lyonsouglas Delo, MD  HYDROcodone-acetaminophen (NORCO/VICODIN) 5-325 MG per tablet Take 1 tablet by mouth every 6 (six) hours as needed.    Historical Provider, MD  HYDROcodone-acetaminophen (NORCO/VICODIN) 5-325 MG per tablet Take 2 tablets by mouth every 4 (four) hours as needed for pain. 01/02/12   Elson AreasLeslie K Sofia, PA-C  HYDROcodone-acetaminophen (NORCO/VICODIN) 5-325 MG tablet Take 1-2 tablets by mouth every 6 (six) hours as needed for moderate pain. 01/23/16   Vanetta MuldersScott Meyah Corle, MD  ibuprofen (ADVIL,MOTRIN) 800 MG tablet Take 1 tablet (800 mg total) by mouth 3 (three) times daily. 11/24/13   Kathrynn Speedobyn M Hess, PA-C  naproxen (NAPROSYN) 500 MG tablet Take 1 tablet (500 mg total) by mouth 2 (two) times daily. 07/26/15   Linwood DibblesJon Knapp, MD  naproxen (NAPROSYN) 500 MG tablet Take 1 tablet (500 mg total) by mouth 2 (two) times daily. 01/23/16   Vanetta MuldersScott Caridad Silveira, MD  nystatin (MYCOSTATIN) powder Apply topically 4 (four) times daily. 05/08/12   Geoffery Lyonsouglas Delo, MD    Family History No family history on file.  Social History Social History  Substance Use Topics  . Smoking status: Current Some Day Smoker    Packs/day: 0.50    Types: Cigarettes  . Smokeless tobacco: Never Used  . Alcohol use No     Allergies   Patient has no known allergies.   Review of  Systems Review of Systems  Constitutional: Negative for chills and fever.  HENT: Positive for congestion, ear pain and sore throat.   Eyes: Negative for visual disturbance.  Respiratory: Negative for cough and shortness of breath.   Cardiovascular: Negative for chest pain.  Gastrointestinal: Negative for abdominal pain, diarrhea, nausea and vomiting.  Genitourinary: Negative for dysuria.  Musculoskeletal: Negative for back pain, joint swelling and myalgias.  Skin: Negative for rash.  Neurological: Positive for headaches.    Hematological: Does not bruise/bleed easily.  Psychiatric/Behavioral: Negative for confusion.     Physical Exam Updated Vital Signs BP 134/78 (BP Location: Left Arm)   Pulse 62   Temp 98.2 F (36.8 C) (Oral)   Resp 18   Ht 5' (1.524 m)   Wt 70.3 kg   SpO2 98%   BMI 30.27 kg/m   Physical Exam  Constitutional: She is oriented to person, place, and time. She appears well-developed and well-nourished. No distress.  HENT:  Head: Normocephalic and atraumatic.  Right Ear: Tympanic membrane normal.  Left Ear: Tympanic membrane normal.  Mouth/Throat: Oropharynx is clear and moist.  Uvula midline. Redness with no exudate.   Eyes: Conjunctivae and EOM are normal. Pupils are equal, round, and reactive to light. No scleral icterus.  Cardiovascular: Normal rate, regular rhythm and normal heart sounds.   Pulmonary/Chest: Effort normal and breath sounds normal.  Abdominal: Soft. Bowel sounds are normal. She exhibits no distension. There is no tenderness.  Neurological: She is alert and oriented to person, place, and time. No cranial nerve deficit or sensory deficit. She exhibits normal muscle tone. Coordination normal.  Skin: Skin is warm and dry.  Psychiatric: She has a normal mood and affect.  Nursing note and vitals reviewed.    ED Treatments / Results  DIAGNOSTIC STUDIES:  Oxygen Saturation is 97% on RA, normal by my interpretation.    COORDINATION OF CARE:  3:46 PM Discussed treatment plan with pt at bedside which includes management of symptoms and pt agreed to plan.  Labs (all labs ordered are listed, but only abnormal results are displayed) Labs Reviewed  RAPID STREP SCREEN (NOT AT [facility])  CULTURE, GROUP A STREP [facility])    EKG  EKG Interpretation None       Radiology No results found.  Procedures Procedures (including critical care time)  Medications Ordered in ED Medications - No data to display   Initial Impression / Assessment and Plan / ED Course  I  have reviewed the triage vital signs and the nursing notes.  Pertinent labs & imaging results that were available during my care of the patient were reviewed by me and considered in my medical decision making (see chart for details).  Clinical Course     Patient nontoxic no acute distress. Rapid strep negative. Most likely viral pharyngitis. Could be beginning of upper respiratory infection.  Final Clinical Impressions(s) / ED Diagnoses   Final diagnoses:  Viral pharyngitis    New Prescriptions New Prescriptions   HYDROCODONE-ACETAMINOPHEN (NORCO/VICODIN) 5-325 MG TABLET    Take 1-2 tablets by mouth every 6 (six) hours as needed for moderate pain.   NAPROXEN (NAPROSYN) 500 MG TABLET    Take 1 tablet (500 mg total) by mouth 2 (two) times daily.   I personally performed the services described in this documentation, which was scribed in my presence. The recorded information has been reviewed and is accurate.       Vanetta MuldersScott Neftali Thurow, MD 01/23/16 [phone]

## 2016-01-23 NOTE — Discharge Instructions (Signed)
Rapid strep test was negative. Throat cultures pending to be notified if it's positive. Symptoms most likely consistent with a viral pharyngitis perhaps the beginning of an upper respiratory infection. Take the Naprosyn as directed. Supplement with the hydrocodone as needed for additional pain relief. Return for any new or worse symptoms.

## 2016-01-26 LAB — CULTURE, GROUP A STREP (THRC)

## 2017-03-14 ENCOUNTER — Other Ambulatory Visit: Payer: Self-pay

## 2017-03-14 ENCOUNTER — Emergency Department (HOSPITAL_BASED_OUTPATIENT_CLINIC_OR_DEPARTMENT_OTHER)
Admission: EM | Admit: 2017-03-14 | Discharge: 2017-03-14 | Disposition: A | Discharge status: Home / Self Care | Payer: Medicare HMO | Attending: Emergency Medicine | Admitting: Emergency Medicine

## 2017-03-14 ENCOUNTER — Encounter (HOSPITAL_BASED_OUTPATIENT_CLINIC_OR_DEPARTMENT_OTHER): Payer: Self-pay

## 2017-03-14 ENCOUNTER — Emergency Department (HOSPITAL_BASED_OUTPATIENT_CLINIC_OR_DEPARTMENT_OTHER): Payer: Medicare HMO

## 2017-03-14 DIAGNOSIS — Z79899 Other long term (current) drug therapy: Secondary | ICD-10-CM | POA: Insufficient documentation

## 2017-03-14 DIAGNOSIS — F17210 Nicotine dependence, cigarettes, uncomplicated: Secondary | ICD-10-CM | POA: Diagnosis not present

## 2017-03-14 DIAGNOSIS — M25562 Pain in left knee: Secondary | ICD-10-CM | POA: Insufficient documentation

## 2017-03-14 DIAGNOSIS — I10 Essential (primary) hypertension: Secondary | ICD-10-CM | POA: Diagnosis not present

## 2017-03-14 MED ORDER — NAPROXEN 500 MG PO TABS: 500.0000 mg | ORAL_TABLET | Freq: Two times a day (BID) | ORAL | 0 refills | Status: DC

## 2017-03-14 NOTE — ED Notes (Signed)
ED Provider at bedside. 

## 2017-03-14 NOTE — Discharge Instructions (Signed)
Please read and follow all provided instructions.  Your diagnoses today include:  1. Acute pain of left knee     Tests performed today include:  An x-ray of the affected area - does NOT show any broken bones  Vital signs. See below for your results today.   Medications prescribed:   Naproxen - anti-inflammatory pain medication  Do not exceed 500mg  naproxen every 12 hours, take with food  You have been prescribed an anti-inflammatory medication or NSAID. Take with food. Take smallest effective dose for the shortest duration needed for your pain. Stop taking if you experience stomach pain or vomiting.   Take any prescribed medications only as directed.  Home care instructions:   Follow any educational materials contained in this packet  Follow R.I.C.E. Protocol:  R - rest your injury   I  - use ice on injury without applying directly to skin  C - compress injury with bandage or splint  E - elevate the injury as much as possible  Follow-up instructions: Please follow-up with the provided orthopedic physician (bone specialist) if you continue to have significant pain in 1 week.   Return instructions:   Please return if your toes or feet are numb or tingling, appear gray or blue, or you have severe pain (also elevate the leg and loosen splint or wrap if you were given one)  Please return to the Emergency Department if you experience worsening symptoms.   Please return if you have any other emergent concerns.  Additional Information:  Your vital signs today were: BP 121/64 (BP Location: Left Arm)    Pulse 66    Temp 98 F (36.7 C) (Oral)    Resp 16    Ht 5' (1.524 m)    Wt 72.1 kg (159 lb)    SpO2 95%    BMI 31.05 kg/m  If your blood pressure (BP) was elevated above 135/85 this visit, please have this repeated by your doctor within one month. --------------

## 2017-03-14 NOTE — ED Triage Notes (Signed)
C/o left knee pain x 5 days-denies recent injury-limping gait

## 2017-03-14 NOTE — ED Provider Notes (Signed)
[facility] EMERGENCY DEPARTMENT Provider Note   CSN: [id number] Arrival date & time: 03/14/17  1253     History   Chief Complaint Chief Complaint  Patient presents with  . Knee Pain    HPI Ruth Miller is a 62 y.o. female.  Patient with history of osteoarthritis presents with complaint of 2 days of left knee pain.  No injury at onset.  Symptoms gradually became worse and she is having difficulty walking today but is able to slowly.  She applied Voltaren gel at home without any improvement.  She has had no redness or warmth or swelling of the joint.  No fevers, nausea or vomiting.  She states that she has had injections in her knee in the past, however this did not really help her.  No history of cardiovascular disease but she does have high cholesterol and hypertension.  No previous MI.  No previous kidney problems. The onset of this condition was acute. The course is constant. Aggravating factors: movement.  Worse at night, better in the morning.       Past Medical History:  Diagnosis Date  . Chronic pain   . GERD (gastroesophageal reflux disease)   . High cholesterol   . Hypercholesteremia   . Hypertension     There are no active problems to display for this patient.   Past Surgical History:  Procedure Laterality Date  . ABDOMINAL HYSTERECTOMY    . WRIST SURGERY      OB History    No data available       Home Medications    Prior to Admission medications   Medication Sig Start Date End Date Taking? Authorizing Provider  GABAPENTIN PO Take by mouth.    [provider]  losartan-hydrochlorothiazide (HYZAAR) 100-12.5 MG per tablet Take 1 tablet by mouth daily.    [provider]  naproxen (NAPROSYN) 500 MG tablet Take 1 tablet (500 mg total) by mouth 2 (two) times daily. 03/14/17   Renne CriglerGeiple, Joelys Staubs, PA-C  nystatin (MYCOSTATIN) powder Apply topically 4 (four) times daily. 05/08/12   Geoffery Lyonselo, Douglas, MD  pantoprazole (PROTONIX) 40 MG tablet  Take 40 mg by mouth daily.    [provider]  simvastatin (ZOCOR) 40 MG tablet Take 40 mg by mouth every evening.    [provider]    Family History No family history on file.  Social History Social History   Tobacco Use  . Smoking status: Current Every Day Smoker    Packs/day: 0.50    Types: Cigarettes  . Smokeless tobacco: Never Used  Substance Use Topics  . Alcohol use: No  . Drug use: No     Allergies   Patient has no known allergies.   Review of Systems Review of Systems  Constitutional: Negative for activity change.  Musculoskeletal: Positive for arthralgias and gait problem (limping). Negative for back pain, joint swelling and neck pain.  Skin: Negative for wound.  Neurological: Negative for weakness and numbness.     Physical Exam Updated Vital Signs BP 121/64 (BP Location: Left Arm)   Pulse 66   Temp 98 F (36.7 C) (Oral)   Resp 16   Ht 5' (1.524 m)   Wt 72.1 kg (159 lb)   SpO2 95%   BMI 31.05 kg/m   Physical Exam  Constitutional: She appears well-developed and well-nourished.  HENT:  Head: Normocephalic and atraumatic.  Eyes: Conjunctivae are normal.  Neck: Normal range of motion. Neck supple.  Cardiovascular:  Pulses:  Dorsalis pedis pulses are 2+ on the left side.       Posterior tibial pulses are 2+ on the left side.  Pulmonary/Chest: No respiratory distress.  Musculoskeletal:       Left hip: Normal.       Left knee: She exhibits normal range of motion, no swelling and no effusion. Tenderness found. Medial joint line, lateral joint line and patellar tendon tenderness noted.       Left ankle: Normal.  Neurological: She is alert.  Distal CMS intact.  Skin: Skin is warm and dry.  Psychiatric: She has a normal mood and affect.  Nursing note and vitals reviewed.    ED Treatments / Results  Labs (all labs ordered are listed, but only abnormal results are displayed) Labs Reviewed - No data to  display   Radiology Dg Knee Complete 4 Views Left  Result Date: 03/14/2017 CLINICAL DATA:  Anterior left knee pain for the past 5 days. EXAM: LEFT KNEE - COMPLETE 4+ VIEW COMPARISON:  Left knee x-rays dated November 24, 2013. FINDINGS: No acute fracture or malalignment. No joint effusion. Mild medial compartment joint space narrowing, similar to prior study. Osteopenia. Vascular calcifications. IMPRESSION: 1. No acute osseous abnormality. Mild medial compartment osteoarthritis, unchanged. Electronically Signed   By: Obie Dredge M.D.   On: 03/14/2017 13:25    Procedures Procedures (including critical care time)  Medications Ordered in ED Medications - No data to display   Initial Impression / Assessment and Plan / ED Course  I have reviewed the triage vital signs and the nursing notes.  Pertinent labs & imaging results that were available during my care of the patient were reviewed by me and considered in my medical decision making (see chart for details).     Patient seen and examined. X-ray reviewed.  Patient does have reasonable flexion of her knee but is in some pain.  No obvious effusion, overlying erythema or warmth to suggest gout or septic joint.  Vital signs reviewed and are as follows: BP 121/64 (BP Location: Left Arm)   Pulse 66   Temp 98 F (36.7 C) (Oral)   Resp 16   Ht 5' (1.524 m)   Wt 72.1 kg (159 lb)   SpO2 95%   BMI 31.05 kg/m   Will start on 10 day course of naproxen and give sports medicine follow-up.  Patient instructed to return with worsening swelling, redness, pain, inability to walk or flex her knee.   Final Clinical Impressions(s) / ED Diagnoses   Final diagnoses:  Acute pain of left knee   Patient with left knee pain, no injury.  X-ray does not show any bony injury or problem.  She does have some previously identified osteoarthritis.  Symptoms most consistent with osteoarthritis.  She does not have any systemic symptoms of illness, effusion,  frequently decreased ROM and I do not feel that arthrocentesis is indicated at this time.  Will start on short course of naproxen.  No previous MI or kidney problems.  She may continue topicals if desired.  ED Discharge Orders        Ordered    naproxen (NAPROSYN) 500 MG tablet  2 times daily     03/14/17 1803       Renne Crigler, [place] 03/14/17 1812    Tegeler, Canary Brim, MD 03/15/17 [phone]

## 2018-01-14 ENCOUNTER — Emergency Department (HOSPITAL_BASED_OUTPATIENT_CLINIC_OR_DEPARTMENT_OTHER): Payer: Medicare HMO

## 2018-01-14 ENCOUNTER — Inpatient Hospital Stay (HOSPITAL_BASED_OUTPATIENT_CLINIC_OR_DEPARTMENT_OTHER)
Admission: EM | Admit: 2018-01-14 | Discharge: 2018-01-17 | DRG: 871 | Disposition: A | Discharge status: Home / Self Care | Payer: Medicare HMO | Attending: Internal Medicine | Admitting: Internal Medicine

## 2018-01-14 ENCOUNTER — Other Ambulatory Visit: Payer: Self-pay

## 2018-01-14 ENCOUNTER — Encounter (HOSPITAL_BASED_OUTPATIENT_CLINIC_OR_DEPARTMENT_OTHER): Payer: Self-pay | Admitting: Emergency Medicine

## 2018-01-14 DIAGNOSIS — K219 Gastro-esophageal reflux disease without esophagitis: Secondary | ICD-10-CM | POA: Diagnosis not present

## 2018-01-14 DIAGNOSIS — J9600 Acute respiratory failure, unspecified whether with hypoxia or hypercapnia: Secondary | ICD-10-CM | POA: Diagnosis present

## 2018-01-14 DIAGNOSIS — D72819 Decreased white blood cell count, unspecified: Secondary | ICD-10-CM | POA: Diagnosis not present

## 2018-01-14 DIAGNOSIS — E785 Hyperlipidemia, unspecified: Secondary | ICD-10-CM | POA: Diagnosis present

## 2018-01-14 DIAGNOSIS — A419 Sepsis, unspecified organism: Secondary | ICD-10-CM

## 2018-01-14 DIAGNOSIS — Z720 Tobacco use: Secondary | ICD-10-CM | POA: Diagnosis present

## 2018-01-14 DIAGNOSIS — G8929 Other chronic pain: Secondary | ICD-10-CM | POA: Diagnosis present

## 2018-01-14 DIAGNOSIS — A4189 Other specified sepsis: Principal | ICD-10-CM | POA: Diagnosis present

## 2018-01-14 DIAGNOSIS — J101 Influenza due to other identified influenza virus with other respiratory manifestations: Secondary | ICD-10-CM | POA: Diagnosis present

## 2018-01-14 DIAGNOSIS — F17210 Nicotine dependence, cigarettes, uncomplicated: Secondary | ICD-10-CM | POA: Diagnosis present

## 2018-01-14 DIAGNOSIS — T380X5A Adverse effect of glucocorticoids and synthetic analogues, initial encounter: Secondary | ICD-10-CM | POA: Diagnosis not present

## 2018-01-14 DIAGNOSIS — E876 Hypokalemia: Secondary | ICD-10-CM | POA: Diagnosis not present

## 2018-01-14 DIAGNOSIS — J9601 Acute respiratory failure with hypoxia: Secondary | ICD-10-CM | POA: Diagnosis present

## 2018-01-14 DIAGNOSIS — Z90710 Acquired absence of both cervix and uterus: Secondary | ICD-10-CM

## 2018-01-14 DIAGNOSIS — R0902 Hypoxemia: Secondary | ICD-10-CM

## 2018-01-14 DIAGNOSIS — R6520 Severe sepsis without septic shock: Secondary | ICD-10-CM | POA: Diagnosis not present

## 2018-01-14 DIAGNOSIS — Z6832 Body mass index (BMI) 32.0-32.9, adult: Secondary | ICD-10-CM

## 2018-01-14 DIAGNOSIS — R509 Fever, unspecified: Secondary | ICD-10-CM | POA: Diagnosis not present

## 2018-01-14 DIAGNOSIS — J329 Chronic sinusitis, unspecified: Secondary | ICD-10-CM | POA: Diagnosis present

## 2018-01-14 DIAGNOSIS — N289 Disorder of kidney and ureter, unspecified: Secondary | ICD-10-CM | POA: Diagnosis not present

## 2018-01-14 DIAGNOSIS — J019 Acute sinusitis, unspecified: Secondary | ICD-10-CM

## 2018-01-14 DIAGNOSIS — I10 Essential (primary) hypertension: Secondary | ICD-10-CM | POA: Diagnosis not present

## 2018-01-14 DIAGNOSIS — R739 Hyperglycemia, unspecified: Secondary | ICD-10-CM | POA: Diagnosis present

## 2018-01-14 DIAGNOSIS — J069 Acute upper respiratory infection, unspecified: Secondary | ICD-10-CM

## 2018-01-14 DIAGNOSIS — Z791 Long term (current) use of non-steroidal anti-inflammatories (NSAID): Secondary | ICD-10-CM

## 2018-01-14 DIAGNOSIS — E7800 Pure hypercholesterolemia, unspecified: Secondary | ICD-10-CM | POA: Diagnosis present

## 2018-01-14 DIAGNOSIS — E669 Obesity, unspecified: Secondary | ICD-10-CM | POA: Diagnosis not present

## 2018-01-14 DIAGNOSIS — Z79899 Other long term (current) drug therapy: Secondary | ICD-10-CM

## 2018-01-14 DIAGNOSIS — I959 Hypotension, unspecified: Secondary | ICD-10-CM

## 2018-01-14 LAB — COMPREHENSIVE METABOLIC PANEL
ALT: 35 U/L (ref 0–44)
AST: 49 U/L — ABNORMAL HIGH (ref 15–41)
Albumin: 3.5 g/dL (ref 3.5–5.0)
Alkaline Phosphatase: 113 U/L (ref 38–126)
Anion gap: 9 (ref 5–15)
BUN: 15 mg/dL (ref 8–23)
CO2: 26 mmol/L (ref 22–32)
Calcium: 8.2 mg/dL — ABNORMAL LOW (ref 8.9–10.3)
Chloride: 101 mmol/L (ref 98–111)
Creatinine, Ser: 1.12 mg/dL — ABNORMAL HIGH (ref 0.44–1.00)
GFR calc Af Amer: >60 mL/min (ref >60)
GFR calc non Af Amer: 53 mL/min — ABNORMAL LOW (ref >60)
Glucose, Bld: 98 mg/dL (ref 70–99)
Potassium: 3.3 mmol/L — ABNORMAL LOW (ref 3.5–5.1)
SODIUM: 136 mmol/L (ref 135–145)
Total Bilirubin: 0.6 mg/dL (ref 0.3–1.2)
Total Protein: 7.5 g/dL (ref 6.5–8.1)

## 2018-01-14 LAB — CBC WITH DIFFERENTIAL/PLATELET
Abs Immature Granulocytes: 0.03 10*3/uL (ref 0.00–0.07)
Basophils Absolute: 0 10*3/uL (ref 0.0–0.1)
Basophils Relative: 0 %
Eosinophils Absolute: 0 10*3/uL (ref 0.0–0.5)
Eosinophils Relative: 0 %
HCT: 42.8 % (ref 36.0–46.0)
Hemoglobin: 13.4 g/dL (ref 12.0–15.0)
Immature Granulocytes: 1 %
Lymphocytes Relative: 52 %
Lymphs Abs: 1.5 10*3/uL (ref 0.7–4.0)
MCH: 30.2 pg (ref 26.0–34.0)
MCHC: 31.3 g/dL (ref 30.0–36.0)
MCV: 96.4 fL (ref 80.0–100.0)
Monocytes Absolute: 0.4 10*3/uL (ref 0.1–1.0)
Monocytes Relative: 15 %
NEUTROS ABS: 0.9 10*3/uL — AB (ref 1.7–7.7)
Neutrophils Relative %: 32 %
Platelets: 150 10*3/uL (ref 150–400)
RBC: 4.44 MIL/uL (ref 3.87–5.11)
RDW: 13.2 % (ref 11.5–15.5)
WBC: 2.9 10*3/uL — AB (ref 4.0–10.5)
nRBC: 0 % (ref 0.0–0.2)

## 2018-01-14 LAB — URINALYSIS, ROUTINE W REFLEX MICROSCOPIC
Bilirubin Urine: NEGATIVE
Bilirubin Urine: NEGATIVE
GLUCOSE, UA: NEGATIVE mg/dL
GLUCOSE, UA: NEGATIVE mg/dL
Hgb urine dipstick: NEGATIVE
Hgb urine dipstick: NEGATIVE
Ketones, ur: NEGATIVE mg/dL
Ketones, ur: NEGATIVE mg/dL
Leukocytes, UA: NEGATIVE
Leukocytes, UA: NEGATIVE
Nitrite: NEGATIVE
Nitrite: NEGATIVE
PH: 5 (ref 5.0–8.0)
Protein, ur: NEGATIVE mg/dL
Protein, ur: NEGATIVE mg/dL
Specific Gravity, Urine: 1.01 (ref 1.005–1.030)
Specific Gravity, Urine: >1.046 — ABNORMAL HIGH (ref 1.005–1.030)
pH: 6 (ref 5.0–8.0)

## 2018-01-14 LAB — GROUP A STREP BY PCR: Group A Strep by PCR: NOT DETECTED

## 2018-01-14 LAB — I-STAT CG4 LACTIC ACID, ED: Lactic Acid, Venous: 0.73 mmol/L (ref 0.5–1.9)

## 2018-01-14 LAB — D-DIMER, QUANTITATIVE: D-Dimer, Quant: 3.23 ug/mL-FEU — ABNORMAL HIGH (ref 0.00–0.50)

## 2018-01-14 LAB — MAGNESIUM: Magnesium: 1.8 mg/dL (ref 1.7–2.4)

## 2018-01-14 LAB — TROPONIN I: Troponin I: <0.03 ng/mL (ref <0.03)

## 2018-01-14 MED ORDER — IOPAMIDOL (ISOVUE-370) INJECTION 76%
100.0000 mL | Freq: Once | INTRAVENOUS | Status: AC | PRN
  Administered 2018-01-14: 100 mL via INTRAVENOUS

## 2018-01-14 MED ORDER — AZITHROMYCIN 500 MG IV SOLR
INTRAVENOUS | Status: AC
  Filled 2018-01-14: qty 500

## 2018-01-14 MED ORDER — SIMVASTATIN 20 MG PO TABS
40.0000 mg | ORAL_TABLET | Freq: Every evening | ORAL | Status: DC
  Administered 2018-01-14 – 2018-01-16 (×3): 40 mg via ORAL
  Filled 2018-01-14 (×3): qty 2

## 2018-01-14 MED ORDER — SODIUM CHLORIDE 0.9 % IV SOLN
INTRAVENOUS | Status: AC
  Administered 2018-01-14 – 2018-01-15 (×2): via INTRAVENOUS

## 2018-01-14 MED ORDER — ONDANSETRON HCL 4 MG PO TABS: 4.0000 mg | ORAL_TABLET | Freq: Four times a day (QID) | ORAL | Status: DC | PRN

## 2018-01-14 MED ORDER — GUAIFENESIN-DM 100-10 MG/5ML PO SYRP
5.0000 mL | ORAL_SOLUTION | ORAL | Status: DC | PRN
  Administered 2018-01-14 – 2018-01-16 (×4): 5 mL via ORAL
  Filled 2018-01-14 (×4): qty 5

## 2018-01-14 MED ORDER — POTASSIUM CHLORIDE CRYS ER 20 MEQ PO TBCR
40.0000 meq | EXTENDED_RELEASE_TABLET | ORAL | Status: AC
  Administered 2018-01-14: 40 meq via ORAL
  Filled 2018-01-14: qty 2

## 2018-01-14 MED ORDER — ACETAMINOPHEN 325 MG PO TABS
650.0000 mg | ORAL_TABLET | Freq: Four times a day (QID) | ORAL | Status: DC | PRN
  Administered 2018-01-15 – 2018-01-16 (×3): 650 mg via ORAL
  Filled 2018-01-14 (×3): qty 2

## 2018-01-14 MED ORDER — PANTOPRAZOLE SODIUM 40 MG PO TBEC
40.0000 mg | DELAYED_RELEASE_TABLET | Freq: Every day | ORAL | Status: DC
  Administered 2018-01-15 – 2018-01-17 (×3): 40 mg via ORAL
  Filled 2018-01-14 (×3): qty 1

## 2018-01-14 MED ORDER — OSELTAMIVIR PHOSPHATE 75 MG PO CAPS
75.0000 mg | ORAL_CAPSULE | Freq: Once | ORAL | Status: AC
  Administered 2018-01-14: 75 mg via ORAL
  Filled 2018-01-14: qty 1

## 2018-01-14 MED ORDER — ALBUTEROL SULFATE (2.5 MG/3ML) 0.083% IN NEBU: 2.5000 mg | INHALATION_SOLUTION | RESPIRATORY_TRACT | Status: DC | PRN

## 2018-01-14 MED ORDER — NAPROXEN 250 MG PO TABS
500.0000 mg | ORAL_TABLET | Freq: Two times a day (BID) | ORAL | Status: DC | PRN
  Administered 2018-01-15 – 2018-01-16 (×2): 500 mg via ORAL
  Filled 2018-01-14 (×3): qty 2

## 2018-01-14 MED ORDER — HYDROCHLOROTHIAZIDE 12.5 MG PO CAPS: 12.5000 mg | ORAL_CAPSULE | Freq: Every day | ORAL | Status: DC

## 2018-01-14 MED ORDER — LOSARTAN POTASSIUM 50 MG PO TABS: 100.0000 mg | ORAL_TABLET | Freq: Every day | ORAL | Status: DC

## 2018-01-14 MED ORDER — GABAPENTIN 100 MG PO CAPS
100.0000 mg | ORAL_CAPSULE | Freq: Every day | ORAL | Status: DC
  Administered 2018-01-15 – 2018-01-16 (×3): 100 mg via ORAL
  Filled 2018-01-14 (×3): qty 1

## 2018-01-14 MED ORDER — METHYLPREDNISOLONE SODIUM SUCC 125 MG IJ SOLR
125.0000 mg | Freq: Once | INTRAMUSCULAR | Status: AC
  Administered 2018-01-14: 125 mg via INTRAVENOUS
  Filled 2018-01-14: qty 2

## 2018-01-14 MED ORDER — ACETAMINOPHEN 325 MG PO TABS
650.0000 mg | ORAL_TABLET | Freq: Once | ORAL | Status: AC
  Administered 2018-01-14: 650 mg via ORAL
  Filled 2018-01-14: qty 2

## 2018-01-14 MED ORDER — ENOXAPARIN SODIUM 40 MG/0.4ML ~~LOC~~ SOLN
40.0000 mg | SUBCUTANEOUS | Status: DC
  Administered 2018-01-15 – 2018-01-16 (×2): 40 mg via SUBCUTANEOUS
  Filled 2018-01-14 (×2): qty 0.4

## 2018-01-14 MED ORDER — ONDANSETRON HCL 4 MG/2ML IJ SOLN
4.0000 mg | Freq: Four times a day (QID) | INTRAMUSCULAR | Status: DC | PRN
  Administered 2018-01-17: 4 mg via INTRAVENOUS
  Filled 2018-01-14: qty 2

## 2018-01-14 MED ORDER — ACETAMINOPHEN 650 MG RE SUPP: 650.0000 mg | Freq: Four times a day (QID) | RECTAL | Status: DC | PRN

## 2018-01-14 MED ORDER — SODIUM CHLORIDE 0.9% FLUSH
3.0000 mL | Freq: Two times a day (BID) | INTRAVENOUS | Status: DC
  Administered 2018-01-14 – 2018-01-16 (×3): 3 mL via INTRAVENOUS

## 2018-01-14 MED ORDER — SODIUM CHLORIDE 0.9 % IV BOLUS
1000.0000 mL | Freq: Once | INTRAVENOUS | Status: AC
  Administered 2018-01-14: 1000 mL via INTRAVENOUS

## 2018-01-14 MED ORDER — LOSARTAN POTASSIUM-HCTZ 100-12.5 MG PO TABS: 1.0000 | ORAL_TABLET | Freq: Every day | ORAL | Status: DC

## 2018-01-14 MED ORDER — SODIUM CHLORIDE 0.9 % IV SOLN
2.0000 g | INTRAVENOUS | Status: DC
  Administered 2018-01-14 – 2018-01-15 (×2): 2 g via INTRAVENOUS
  Filled 2018-01-14 (×4): qty 20

## 2018-01-14 MED ORDER — SODIUM CHLORIDE 0.9 % IV SOLN
500.0000 mg | INTRAVENOUS | Status: DC
  Administered 2018-01-14 – 2018-01-15 (×2): 500 mg via INTRAVENOUS
  Filled 2018-01-14 (×4): qty 500

## 2018-01-14 MED ORDER — NICOTINE 21 MG/24HR TD PT24
21.0000 mg | MEDICATED_PATCH | Freq: Every day | TRANSDERMAL | Status: DC
  Filled 2018-01-14 (×3): qty 1

## 2018-01-14 MED ORDER — BENZONATATE 100 MG PO CAPS
200.0000 mg | ORAL_CAPSULE | ORAL | Status: AC
  Administered 2018-01-14: 200 mg via ORAL
  Filled 2018-01-14: qty 2

## 2018-01-14 NOTE — H&P (Signed)
History and Physical    Ruth PennerMary Dorrance Miller:[id number]:[id number] DOB: 1955-05-08 DOA: 01/14/2018  Referring MD/NP/PA: Huey Bienenstockawood Elgergawy, MD PCP: Cheron SchaumannVelazquez, Ruth Y., MD  [facility]  Chief Complaint: Cough  I have personally briefly reviewed patient's old medical [facility]   HPI: Ruth Miller is a 62 y.o. female with medical history significant of HTN, HLD, tobacco use, and GERD; who presents with complaints of cough for approximately 4 days.  Complains of a dry cough with associated symptoms of sinus congestion, cough, sore throat left ear pain, headache, fever up to 102 F, generalized malaise sore throat, nausea, and 3-4 episodes of nonbloody diarrhea per day.  Tried using over-the-counter medications for cold without relief of symptoms.  Complains of some chest discomfort from coughing.  Denies having any chest pain, vomiting, abdominal pain, dysuria, wheezing, weight gain, blood in stools, leg swelling/calf pain, recent antibiotic use, or history of sarcoidosis.  Patient does admit to smoking half a pack cigarettes per day on average and has smoked for over 12 years.  Ms. Ruth Miller does admit to having similar symptoms in the past related with sinus infections.  Last sinus infection was over 1 year ago.   ED Course: Upon a patient admission into the emergency department patient was seen to be febrile up to 100.9 F, respirations up to 23, blood pressure 88/62- 116/59, and O2 saturations as low as 86% on room air requiring placement to 2 L of nasal cannula oxygen to obtain improvement of O2 saturations.  Lab work revealed WBC 2.9, potassium 3.3, BUN 15, creatinine 1.12, AST 49, lactic acid 0.73, troponin negative, d-dimer 3.23, and all other values are relatively within normal limits.  Urinalysis and chest x-ray did not show any acute signs of infection.  Group A strep was not detected.  Due to the elevated d-dimer and a CT angiogram of the chest was obtained.  The  study did not show any signs of a pulmonary embolus, but did show mild thoracic lymphadenopathy.  Influenza screen was still pending..  Sepsis protocol has been initiated with patient received Tylenol, 2 L bolus of normal saline IV fluids, 125 mg of Solu-Medrol, azithromycin, ceftriaxone, and 1 dose of Tamiflu.  Patient was accepted to [facility].   Review of Systems  Constitutional: Positive for chills, fever and malaise/fatigue.  HENT: Positive for congestion, ear pain and sore throat. Negative for sinus pain.   Eyes: Negative for double vision and photophobia.  Respiratory: Positive for cough. Negative for wheezing.   Cardiovascular: Negative for chest pain and leg swelling.  Gastrointestinal: Negative for abdominal pain, nausea and vomiting.  Genitourinary: Negative for dysuria and hematuria.  Musculoskeletal: Positive for myalgias. Negative for falls.  Skin: Negative for itching and rash.  Neurological: Positive for headaches. Negative for loss of consciousness.  Endo/Heme/Allergies: Negative for polydipsia.  Psychiatric/Behavioral: Negative for hallucinations and memory loss.    Past Medical History:  Diagnosis Date  . Chronic pain   . GERD (gastroesophageal reflux disease)   . High cholesterol   . Hypercholesteremia   . Hypertension     Past Surgical History:  Procedure Laterality Date  . ABDOMINAL HYSTERECTOMY    . WRIST SURGERY       reports that she has been smoking cigarettes. She has been smoking about 0.50 packs per day. She has never used smokeless tobacco. She reports that she does not drink alcohol or use drugs.  No Known Allergies  History  reviewed. No pertinent family history.  Prior to Admission medications   Medication Sig Start Date End Date Taking? Authorizing Provider  GABAPENTIN PO Take by mouth.    [provider]  losartan-hydrochlorothiazide (HYZAAR) 100-12.5 MG per tablet Take 1 tablet by mouth daily.    [provider]  naproxen (NAPROSYN) 500 MG tablet Take 1 tablet (500 mg total) by mouth 2 (two) times daily. 03/14/17   Renne Crigler, PA-C  nystatin (MYCOSTATIN) powder Apply topically 4 (four) times daily. 05/08/12   Geoffery Lyons, MD  pantoprazole (PROTONIX) 40 MG tablet Take 40 mg by mouth daily.    [provider]  simvastatin (ZOCOR) 40 MG tablet Take 40 mg by mouth every evening.    [provider]    Physical Exam:  Constitutional: Older female who appears to be sick and intermittently coughing. Vitals:   01/14/18 1600 01/14/18 1730 01/14/18 1800 01/14/18 2020  BP: 99/66 (!) 91/57 (!) 94/57 (!) 116/59  Pulse: 65 74 72 64  Resp: 16 (!) 8 16 18   Temp:    98.2 F (36.8 C)  TempSrc:    Oral  SpO2: 95% 93% 97% 99%  Weight:      Height:       Eyes: PERRL, lids and conjunctivae normal ENMT: Mucous membranes are moist.  Posterior cobbling of the oropharynx.  Left ear erythema with purulent material behind tympanic membrane Neck: normal, submandibular lymphadenopathy noted. Respiratory: clear to auscultation bilaterally, no wheezing, no crackles. Normal respiratory effort. No accessory muscle use.  Currently on 2 L nasal cannula oxygen. Cardiovascular: Regular rate and rhythm, no murmurs / rubs / gallops. No extremity edema. 2+ pedal pulses. No carotid bruits.  Abdomen: no tenderness, no masses palpated. No hepatosplenomegaly. Bowel sounds positive.  Musculoskeletal: no clubbing / cyanosis. No joint deformity upper and lower extremities. Good ROM, no contractures. Normal muscle tone.  Skin: no rashes, lesions, ulcers. No induration Neurologic: CN 2-12 grossly intact. Sensation intact, DTR normal. Strength 5/5 in all 4.  Psychiatric: Normal judgment and insight. Alert and oriented x 3. Normal mood.     Labs on Admission: I have personally reviewed following labs and imaging studies  CBC: Recent Labs  Lab 01/14/18 1421  WBC 2.9*  NEUTROABS 0.9*  HGB 13.4  HCT  42.8  MCV 96.4  PLT 150   Basic Metabolic Panel: Recent Labs  Lab 01/14/18 1421  NA 136  K 3.3*  CL 101  CO2 26  GLUCOSE 98  BUN 15  CREATININE 1.12*  CALCIUM 8.2*  MG 1.8   GFR: Estimated Creatinine Clearance: 44.8 mL/min (A) (by C-G formula based on SCr of 1.12 mg/dL (H)). Liver Function Tests: Recent Labs  Lab 01/14/18 1421  AST 49*  ALT 35  ALKPHOS 113  BILITOT 0.6  PROT 7.5  ALBUMIN 3.5   No results for input(s): LIPASE, AMYLASE in the last 168 hours. No results for input(s): AMMONIA in the last 168 hours. Coagulation Profile: No results for input(s): INR, PROTIME in the last 168 hours. Cardiac Enzymes: Recent Labs  Lab 01/14/18 1421  TROPONINI <0.03   BNP (last 3 results) No results for input(s): PROBNP in the last 8760 hours. HbA1C: No results for input(s): HGBA1C in the last 72 hours. CBG: No results for input(s): GLUCAP in the last 168 hours. Lipid Profile: No results for input(s): CHOL, HDL, LDLCALC, TRIG, CHOLHDL, LDLDIRECT in the last 72 hours. Thyroid Function Tests: No results for input(s): TSH, T4TOTAL, FREET4, T3FREE, THYROIDAB in  the last 72 hours. Anemia Panel: No results for input(s): VITAMINB12, FOLATE, FERRITIN, TIBC, IRON, RETICCTPCT in the last 72 hours. Urine analysis:    Component Value Date/Time   COLORURINE YELLOW 01/14/2018 1417   APPEARANCEUR CLEAR 01/14/2018 1417   LABSPEC 1.010 01/14/2018 1417   PHURINE 6.0 01/14/2018 1417   GLUCOSEU NEGATIVE 01/14/2018 1417   HGBUR NEGATIVE 01/14/2018 1417   BILIRUBINUR NEGATIVE 01/14/2018 1417   KETONESUR NEGATIVE 01/14/2018 1417   PROTEINUR NEGATIVE 01/14/2018 1417   NITRITE NEGATIVE 01/14/2018 1417   LEUKOCYTESUR NEGATIVE 01/14/2018 1417   Sepsis Labs: Recent Results (from the past 240 hour(s))  Group A Strep by PCR     Status: None   Collection Time: 01/14/18  3:07 PM  Result Value Ref Range Status   Group A Strep by PCR NOT DETECTED NOT DETECTED Final    Comment: Performed  at [facility], [address] [place], [place] [address]     Radiological Exams on Admission: Dg Chest 2 View  Result Date: 01/14/2018 CLINICAL DATA:  Cough, congestion and shortness of breath. EXAM: CHEST - 2 VIEW COMPARISON:  05/10/2016 FINDINGS: The cardiac silhouette, mediastinal and hilar contours are normal and stable. The lungs are clear. No pleural effusion. The bony thorax is intact. IMPRESSION: No acute cardiopulmonary findings. Electronically Signed   By: Rudie Meyer M.D.   On: 01/14/2018 15:56   Ct Angio Chest Pe W/cm &/or Wo Cm  Result Date: 01/14/2018 CLINICAL DATA:  Shortness of breath, cough, body aches, chills. Hypoxia. Elevated D-dimer. EXAM: CT ANGIOGRAPHY CHEST WITH CONTRAST TECHNIQUE: Multidetector CT imaging of the chest was performed using the standard protocol during bolus administration of intravenous contrast. Multiplanar CT image reconstructions and MIPs were obtained to evaluate the vascular anatomy. CONTRAST:  ISOVUE-370 IOPAMIDOL (ISOVUE-370) INJECTION 76% COMPARISON:  Chest radiographs dated 01/14/2017. FINDINGS: Cardiovascular: Satisfactory opacification of the bilateral pulmonary arteries to the segmental level. No evidence of pulmonary embolism. No evidence of thoracic aortic aneurysm or dissection. Very mild atherosclerotic calcifications of the aortic arch and descending thoracic aorta. The heart is normal in size.  No pericardial effusion. Mediastinum/Nodes: Mild thoracic lymphadenopathy, including: --13 mm short axis high right paratracheal node (series 6/image 18) --11 mm short axis prevascular node (series 6/image 25) --14 mm short axis low right paratracheal node (series 6/image 28) --11 mm short axis right hilar node (series 6/image 31) --12 mm short axis subcarinal node (series 6/image 31) Visualized thyroid is unremarkable. Lungs/Pleura: Mild centrilobular and paraseptal emphysematous changes. No focal consolidation. Mild dependent  atelectasis in the posterior right upper lobe and bilateral lower lobes. No suspicious pulmonary nodules. No pleural effusion or pneumothorax. Upper Abdomen: Visualized upper abdomen is notable for a tiny hiatal hernia but is otherwise unremarkable. Spleen is normal in size. Musculoskeletal: Mild degenerative changes of the mid thoracic spine. Review of the MIP images confirms the above findings. IMPRESSION: No evidence of pulmonary embolism. Mild thoracic lymphadenopathy, nonspecific. This appearance may be reactive or related to a systemic process such as sarcoidosis. However, follow-up CT chest with contrast is suggested in 3-6 months to assess for progression. Aortic Atherosclerosis (ICD10-I70.0) and Emphysema (ICD10-J43.9). Electronically Signed   By: Charline Bills M.D.   On: 01/14/2018 16:59    EKG: Independently reviewed.  Sinus rhythm at 71 bpm  Assessment/Plan Acute respiratory failure with hypoxia, sepsis 2/2 suspected bacterial sinusitis/left ear otitis media vs. upper respiratory viral illness: Patient presents with fever up to 100.9 with tachypnea, leukopenia, and low  blood pressures.  Lactic acid was reassuring at 0.73.  Imaging studies of the chest showed some mid thoracic lymphadenopathy that was nonspecific.  Patient denies history of sarcoidosis.  On physical exam appreciated sinus tenderness with left ear erythema and purulent appearing fluid behind the tympanic membrane. - Admit to a telemetry bed - Continuous pulse oximetry with nasal cannula oxygen - Follow-up blood/sputum studies/cultures - Follow-up influenza screen - Continue empiric ceftriaxone and azithromycin - De-escalate when medically appropriate - Albuterol nebs as needed - mucinex  Nausea and diarrhea: Patient denies any recent antibiotic use. - Antiemetics as needed - IV fluids of normal saline IV fluids at 75 ml/hr for 1 L - Continue symptomatic treatment  Hypokalemia: Acute initial potassium noted be 3.3  on admission. - Give 40 mEq of potassium chloride x1 dose now - Continue to monitor and replace as needed  Essential hypertension - Restart when medically losartan -hydrochlorothiazide  Renal insufficiency: Initial creatinine noted to be 1.12 with BUN of 15.  Patient on diuretic with recent complaints of diarrhea. - Recheck kidney function in a.m.  Hyperlipidemia - Continue simvastatin  Tobacco abuse - Counseled patient on need cessation of tobacco - Nicotine patch offered  DVT prophylaxis: Lovenox Code Status: Full Family Communication: No family present at bedside Disposition Plan: Likely discharge home in 1 to 2 days Consults called: none  Admission status: Observation  Clydie Braun MD [facility] Pager [phone]   If 7PM-7AM, please contact night-coverage [email or web address] Password [email or web address]  01/14/2018, 9:02 PM

## 2018-01-14 NOTE — ED Notes (Signed)
Report given to Jeff with [facility]. 

## 2018-01-14 NOTE — ED Triage Notes (Signed)
Cough, body aches, chills x 3 days.

## 2018-01-14 NOTE — ED Notes (Signed)
ED Provider at bedside. 

## 2018-01-14 NOTE — Progress Notes (Signed)
62 Y/O female presents with SOB, fever, pleuritic chest pain, has SIRS, with hyoxia, as well hypotension which responded to IV fluids, elevated D dimers, but CTA chest negative for PE, most likley has viral illness, influenza panel pending, already received azithromycin and rocephin, still to receive tamiflu and steroids , requiring 3 L/ (new ), accepted to tele bed. Huey Bienenstockawood Lanetta Figuero MD

## 2018-01-14 NOTE — ED Provider Notes (Signed)
[facility] EMERGENCY DEPARTMENT Provider Note   CSN: [id number] Arrival date & time: 01/14/18  1343     History   Chief Complaint Chief Complaint  Patient presents with  . Flu like symptoms    HPI Ruth Miller is a 62 y.o. female with a hx of tobacco abuse, HTN, hypercholesterolemia, and GERD who presents to the ED with flu like sxs x 6 days. Patient reports she has had generalized body aches, chills, subjective fevers, bilateral ear pain, sore throat, and dry cough. She states that with coughing she has chest pain centrally, otherwise no chest pain. Reports she has also felt short of breath- this is worse with exertion. Relays some generalized weakness as well as lightheadedness when transitioning from sitting to standing, no syncope episodes have occurred. Several loose stools over the past 3 days, no blood. Has tried pseudophedrine for her sxs without relief. Other than what is mentioned above no specific alleviating/aggravating factors. Denies congestion, nausea, vomiting, abdominal pain, dysuria, or syncope. Denies leg pain/swelling, hemoptysis, recent surgery/trauma, recent long travel, hormone use, personal hx of cancer, or hx of DVT/PE.   She takes her hyzaar and lisinopril at night, did not have this last night. She has a hx of tobacco abuse, has not smoked since onset of illness.   HPI  Past Medical History:  Diagnosis Date  . Chronic pain   . GERD (gastroesophageal reflux disease)   . High cholesterol   . Hypercholesteremia   . Hypertension     There are no active problems to display for this patient.   Past Surgical History:  Procedure Laterality Date  . ABDOMINAL HYSTERECTOMY    . WRIST SURGERY       OB History   None      Home Medications    Prior to Admission medications   Medication Sig Start Date End Date Taking? Authorizing Provider  GABAPENTIN PO Take by mouth.    [provider]  losartan-hydrochlorothiazide (HYZAAR) 100-12.5  MG per tablet Take 1 tablet by mouth daily.    [provider]  naproxen (NAPROSYN) 500 MG tablet Take 1 tablet (500 mg total) by mouth 2 (two) times daily. 03/14/17   Renne Crigler, PA-C  nystatin (MYCOSTATIN) powder Apply topically 4 (four) times daily. 05/08/12   Geoffery Lyons, MD  pantoprazole (PROTONIX) 40 MG tablet Take 40 mg by mouth daily.    [provider]  simvastatin (ZOCOR) 40 MG tablet Take 40 mg by mouth every evening.    [provider]    Family History No family history on file.  Social History Social History   Tobacco Use  . Smoking status: Current Every Day Smoker    Packs/day: 0.50    Types: Cigarettes  . Smokeless tobacco: Never Used  Substance Use Topics  . Alcohol use: No  . Drug use: No     Allergies   Patient has no known allergies.   Review of Systems Review of Systems  Constitutional: Positive for chills and fever (subjective).  HENT: Positive for ear pain (bilateral) and sore throat. Negative for congestion, trouble swallowing and voice change.   Respiratory: Positive for cough and shortness of breath.   Cardiovascular: Positive for chest pain (w/ coughing). Negative for leg swelling.  Gastrointestinal: Positive for diarrhea. Negative for abdominal pain, blood in stool, nausea and vomiting.  Genitourinary: Negative for dysuria.  Neurological: Positive for weakness (generalized) and light-headedness (w/ position transitions). Negative for syncope.  All other systems reviewed and  are negative.   Physical Exam Updated Vital Signs BP (!) 88/62 (BP Location: Left Arm) Comment: checked x 2  Pulse 92   Temp 99.2 F (37.3 C) (Oral)   Resp 20   Ht 5' (1.524 m)   Wt 66.2 kg   SpO2 99%   BMI 28.51 kg/m   Physical Exam  Constitutional: She appears well-developed and well-nourished. No distress.  HENT:  Head: Normocephalic and atraumatic.  Right Ear: No mastoid tenderness. Tympanic membrane is not retracted and not  bulging.  Left Ear: No mastoid tenderness. Tympanic membrane is not retracted and not bulging.  Nose: Mucosal edema present.  Mouth/Throat: Uvula is midline. Posterior oropharyngeal erythema (mild) present. No oropharyngeal exudate.  Some fullness behind the TMs bilaterally, no loss of landmarks.   Eyes: Pupils are equal, round, and reactive to light. Conjunctivae and EOM are normal. Right eye exhibits no discharge. Left eye exhibits no discharge.  Neck: Normal range of motion. Neck supple. No neck rigidity. No edema and no erythema present.  Cardiovascular: Normal rate and regular rhythm.  No murmur heard. Pulmonary/Chest: She has no wheezes. She has rhonchi (scattered, clear with cough). She has no rales. She exhibits tenderness (sternal without overlying skin changes or palpable crepitus/deformity).  Desating into the low 90s upper 80s on RA placed on O2 via [place] with improvement.   Abdominal: Soft. She exhibits no distension. There is no tenderness. There is no rigidity, no rebound and no guarding.  Musculoskeletal: She exhibits no edema or tenderness.  Lymphadenopathy:    She has no cervical adenopathy.  Neurological: She is alert.  Skin: Skin is warm and dry. No rash noted.  Psychiatric: She has a normal mood and affect. Her behavior is normal.  Nursing note and vitals reviewed.    ED Treatments / Results  Labs (all labs ordered are listed, but only abnormal results are displayed) Labs Reviewed  CBC WITH DIFFERENTIAL/PLATELET - Abnormal; Notable for the following components:      Result Value   WBC 2.9 (*)    Neutro Abs 0.9 (*)    All other components within normal limits  COMPREHENSIVE METABOLIC PANEL - Abnormal; Notable for the following components:   Potassium 3.3 (*)    Creatinine, Ser 1.12 (*)    Calcium 8.2 (*)    AST 49 (*)    GFR calc non Af Amer 53 (*)    All other components within normal limits  D-DIMER, QUANTITATIVE (NOT AT [facility]) - Abnormal; Notable for the  following components:   D-Dimer, Quant 3.23 (*)    All other components within normal limits  GROUP A STREP BY PCR  CULTURE, BLOOD (ROUTINE X 2)  CULTURE, BLOOD (ROUTINE X 2)  MAGNESIUM  TROPONIN I  URINALYSIS, ROUTINE W REFLEX MICROSCOPIC  INFLUENZA PANEL BY PCR (TYPE A & B)  I-STAT CG4 LACTIC ACID, ED  I-STAT CG4 LACTIC ACID, ED    EKG EKG Interpretation  Date/Time:  Sunday January 14 2018 14:46:15 EST Ventricular Rate:  71 PR Interval:    QRS Duration: 74 QT Interval:  398 QTC Calculation: 433 R Axis:   50 Text Interpretation:  Sinus rhythm Borderline short PR interval Probable anteroseptal infarct, old no prior ECG for comparison,  No STEMI Confirmed by Theda Belfast ([id number]) on 01/14/2018 4:03:24 PM   Radiology Dg Chest 2 View  Result Date: 01/14/2018 CLINICAL DATA:  Cough, congestion and shortness of breath. EXAM: CHEST - 2 VIEW COMPARISON:  05/10/2016 FINDINGS: The cardiac silhouette, mediastinal  and hilar contours are normal and stable. The lungs are clear. No pleural effusion. The bony thorax is intact. IMPRESSION: No acute cardiopulmonary findings. Electronically Signed   By: P.  Gallerani M.D.   On: 01/14/2018 15:56   Ct Angio Chest Pe W/cm &/or Wo Cm  Result Date: 01/14/2018 CLINICAL DATA:  Shortness of breath, cough, body aches, chills. Hypoxia. Elevated D-dimer. EXAM: CT ANGIOGRAPHY CHEST WITH CONTRAST TECHNIQUE: Multidetector CT imaging of the chest was performed using the standard protocol during bolus administration of intravenous contrast. Multiplanar CT image reconstructions and MIPs were obtained to evaluate the vascular anatomy. CONTRAST:  <MEASUREMEN27MontenegroHar8295621Julieanne Lu81Judie Petit191a170 Taylor DrM51Southwest S73anBrett 19MontenegroAd8295621Julieanne Lu81Judie Petit191a5 Foster LM55Juncti3onBrett 87MontenegroAphrodi8295621Julieanne Lu81Judie Petit191a35 [address] [place] M68Nichol62asBrett 32MontenegroTr8295621Julieanne Lu81Judie Petit191a713 [place] DrM5251OBrett 89MontenegroLoist8295621Julieanne Lu81Judie Petit191a323 [place] StrM2Lake H67amBrett 67MontenegroSh8295621Julieanne Lu81Judie Petit191a8979 Rockwell AM36Al70leBrett 82MontenegroLynsi8295621Julieanne Lu81Judie Petit191a806 Armstrong StrM42Edwar12dsBrett 63MontenegroLorr8295621Julieanne Lu81Judie Petit191a80 Adams StrM391InBrett 35MontenegroM8295621Julieanne Lu81Judie Petit191a7961 [place] M64Vall74eyBrett 65MontenegroIre8295621Julieanne Lu81Judie Petit191a8545 Maple AM6318KnBrett 36MontenegroTay8295621Julieanne Lu81Judie Petit191a7776 [place] M8110SBrett 56MontenegroLamiya8295621Julieanne Lu81Judie Petit191a429 Griffin LM70Wort39hiBrett 49MontenegroLacr8295621Julieanne Lu81Judie Petit191a7403 Tallwood M64Eas78t Brett 61Montenegro8295621Julieanne Lu81Judie Petit191a9 Proctor M400Brett 32MontenegroTr8295621Julieanne Lu81Judie Petit191a234 Old Golf AveM50Sp54auBrett 30MontenegroRan8295621Julieanne Lu81Judie Petit191a7967 Brookside DrM75East Pleasa7ntBrett 85MontenegroIshan8295621Julieanne Lu81Judie Petit191a57 T[place] Hill AM55Pr9inBrett 37MontenegroRey8295621Julieanne Lu81Judie Petit191a10 Edgemont AveM34West Baden 69SpBrett 55MontenegroRa8295621Julieanne Lu81Judie Petit191a639 Vermont StrM62Mil72l Brett 75MontenegroBriah8295621Julieanne Lu81Judie Petit191a62 Manor Station CoM44Cle18arBrett 75MontenegroTi8295621Julieanne Lu81Judie Petit191a390 Fifth M35Ab64byBrett 9Montenegro8295621Julieanne Lu81Judie Petit191a673 East Ram[place] StrM35Man31chBrett 74MontenegroBr8295621Julieanne Lu81Judie Petit191a2 Brickyard M40Pa50rkBrett 53MontenegroKeishaw8295621Julieanne Lu81Judie Petit191a8176 W. [place] M34Morriso70n Brett 64MontenegroLey8295621Julieanne Lu81Judie Petit191a62 [place] 69M1Brett 69MontenegroGwyndol8295621Julieanne Lu81Judie Petit191a34 [place] M67Kin29gfBrett 79MontenegroSher8295621Julieanne Lu81Judie Petit191a9603 Plymouth DrM70Se85thBrett 45MontenegroNilam8295621Julieanne Lu81Judie Petit191a393 West StrM4662OBrett 32MontenegroK8295621Julieanne Lu81Judie Petit191a9823 W. [place] M21L47awBrett 52MontenegroLin8295621Julieanne Lu81Judie Petit191a56 [place] RM30Ston54y Brett 76MontenegroK82981Judie Petit19129301 Grove AM69St54onBrett 22MontenegroChis8295621Julieanne Lu81Judie Petit191a8503 East [place] RM2323AnBrett 71MontenegroE8295621Julieanne Lu81Judie Petit191a34 6th M6034GaBrett 39MontenegroLo8295621Julieanne Lu81Judie Petit191a9410 Johnson RM27Dee5r Brett 67MontenegroNe8295621Julieanne Lu81Judie Petit191a229 West [place] AM60G42alBrett 19MontenegroFabi8295621Julieanne Lu81Judie Petit191a569 [place] LM49Ir61onBrett 39MontenegroAu8295621Julieanne Lu81Judie Petit191a9487 [place] C2oMBrett 85MontenegroMaryl8295621Julieanne Lu81Judie Petit191a179 S. [place] M65B37luBrett 57MontenegroKon8295621Julieanne Lu81Judie Petit191a39 Hill [place] M69Hawaiian Paradi37seBrett 84MontenegroFr8295621Julieanne Lu81Judie Petit191a7755 Carriage AM7528HaBrett 49MontenegroJill8295621Julieanne Lu81Judie Petit191a7493 Augusta M4451TrBrett 74MontenegroKerian8295621Julieanne Lu81Judie Petit191a9798 Pendergast CoM6430RBrett 40MontenegroK8295621Julieanne Lu81Judie Petit191a9047 Kingston DrM48Me45ndBrett 22MontenegroNye8295621Julieanne Lu81Judie Petit191a30 Brown 22M5Brett 2MontenegroC8295621Julieanne Lu81Judie Petit191a9386 Anderson AM45Ro11thBrett 50MontenegroSeri8295621Julieanne Lu81Judie Petit191a8308 West [place] M25Mounta47inBrett 55MontenegroSora82981Judie Petit1912236 [place] 63M6Brett CanaJohny Shears consultantISOVUE-370) INJECTION 76% COMPARISON:  Chest radiographs dated 01/14/2017. FINDINGS: Cardiovascular: Satisfactory opacification of the bilateral pulmonary arteries to the segmental level. No evidence of pulmonary embolism. No evidence of thoracic aortic aneurysm or dissection. Very mild  atherosclerotic calcifications of the aortic arch and descending thoracic aorta. The heart is normal in size.  No pericardial effusion. Mediastinum/Nodes: Mild thoracic lymphadenopathy, including: --13 mm short axis high right paratracheal node (series 6/image 18) --11 mm short axis prevascular node (series 6/image 25) --14 mm short axis low right paratracheal node (series 6/image 28) --11 mm short axis right hilar node (series 6/image 31) --12 mm short axis subcarinal node (series 6/image 31) Visualized thyroid is unremarkable. Lungs/Pleura: Mild centrilobular and paraseptal emphysematous changes. No focal consolidation. Mild dependent atelectasis in the posterior right upper lobe and bilateral lower lobes. No suspicious pulmonary nodules. No pleural effusion or pneumothorax. Upper Abdomen: Visualized upper abdomen is notable for a tiny hiatal hernia but is otherwise unremarkable. Spleen is normal in size. Musculoskeletal: Mild degenerative changes of the mid thoracic spine. Review of the MIP images confirms the above findings. IMPRESSION: No evidence of pulmonary embolism. Mild thoracic lymphadenopathy, nonspecific. This appearance may be reactive or related to a systemic process such as sarcoidosis. However, follow-up CT chest with contrast is suggested in 3-6 months to assess for progression. Aortic Atherosclerosis (ICD10-I70.0) and Emphysema (ICD10-J43.9). Electronically Signed   By: Sriyesh  Krishnan M.D.   On: 01/14/2018 16:59    Procedures Procedures (including critical care time)   Medications Ordered in ED Medications - No data to display   Initial Impression / Assessment and Plan / ED Course  I have reviewed the triage vital signs and the nursing notes.  Pertinent labs & imaging results that were available during my care of the patient were reviewed by me and considered in my medical decision making (see chart for details).   Patient presents to the ED with flu like sxs for the past 6  days. She is notably hypotensive and hypoxic with desaturation into the upper 80s on RA- improved with supplemental O2 application. Congestion noted. Lungs with some scattered mild rhonchi, clears w/ coughing, otherwise no obvious adventitious sounds. Will check rectal temp given low grade oral temp at 99.2. Expansive work up initiated given patient presentation and vitals.   Leukopenia at 2.9. No anemia. Mild elevation in creatinine compared to prior. Mild hypokalemia/hypocalcemia. Strep negative. Flu pending. UA pending. Trop negative EKG non STEMI- lower suspicion for ACS. Given hypoxia further evaluated with D-dimer elevated at 3.23- will proceed with CTA. CXR without infiltrate or other acute cardiopulmonary disease.  16:02: CXR negative, patient meets sepsis criteria- leukopenia, tachypnea, fever, hypotension upon arrival, clinical concern for pneumonia given rhonchi and hypoxia, will start CAP abx in the mean time pending CTA for PE as well as influenza swab- possibly flu, considered bacterial sinusitis however sx <  7 days, possibly other viral illness.   CTA negative for PE.  Mild thoracic lymphadenopathy, nonspecific- appearance may be reactive or related to a systemic process such as sarcoidosis (patient denies hx of sarcoidosis), however, follow-up CT chest with contrast is suggested in 3-6 months to assess for progression. Aortic Atherosclerosis and Emphysema also noted. No formally diagnosed COPD, no wheezing on exam.   Patient remains with hypoxia, now requiring 3L to maintain SpO2 >90%. BP improved, remains somewhat soft, additional fluids ordered. Feel patient requires admission for further monitoring and care. [place]- this was attempted but no beds available. Consult placed to [employer]   Findings and plan of care discussed with supervising physician Dr. Rush Landmark who is in agreement.   17:55: CONSULT: Discussed with  hospitalist Dr. Randol Kern accepts admission, he is requesting 75 mg Tamiflu & 125 mg Solumedrol tonight. Orders placed. Pending bed placement.   Final Clinical Impressions(s) / ED Diagnoses   Final diagnoses:  Hypoxia  Hypotension, unspecified hypotension type  Upper respiratory tract infection, unspecified type    ED Discharge Orders    None       Cherly Anderson, PA-C 01/14/18 2351    Tegeler, Canary Brim, MD 01/15/18 [phone]

## 2018-01-14 NOTE — ED Notes (Signed)
Pt O2 sats 86-90% on room air. Provider notified. 2L nasal cannula started per [facility]OR[facility]

## 2018-01-14 NOTE — ED Notes (Signed)
Patient transported to X-ray 

## 2018-01-14 NOTE — ED Notes (Signed)
[facility] via [facility].  Family at bedside during this transfer.

## 2018-01-15 DIAGNOSIS — Z720 Tobacco use: Secondary | ICD-10-CM | POA: Diagnosis present

## 2018-01-15 DIAGNOSIS — J329 Chronic sinusitis, unspecified: Secondary | ICD-10-CM | POA: Diagnosis present

## 2018-01-15 DIAGNOSIS — E876 Hypokalemia: Secondary | ICD-10-CM | POA: Diagnosis not present

## 2018-01-15 DIAGNOSIS — A419 Sepsis, unspecified organism: Secondary | ICD-10-CM | POA: Insufficient documentation

## 2018-01-15 DIAGNOSIS — J101 Influenza due to other identified influenza virus with other respiratory manifestations: Secondary | ICD-10-CM | POA: Diagnosis not present

## 2018-01-15 DIAGNOSIS — J9601 Acute respiratory failure with hypoxia: Secondary | ICD-10-CM | POA: Diagnosis not present

## 2018-01-15 DIAGNOSIS — N289 Disorder of kidney and ureter, unspecified: Secondary | ICD-10-CM | POA: Insufficient documentation

## 2018-01-15 LAB — CBC
HCT: 39 % (ref 36.0–46.0)
Hemoglobin: 12.1 g/dL (ref 12.0–15.0)
MCH: 29.9 pg (ref 26.0–34.0)
MCHC: 31 g/dL (ref 30.0–36.0)
MCV: 96.3 fL (ref 80.0–100.0)
Platelets: 134 10*3/uL — ABNORMAL LOW (ref 150–400)
RBC: 4.05 MIL/uL (ref 3.87–5.11)
RDW: 13.1 % (ref 11.5–15.5)
WBC: 1.8 10*3/uL — ABNORMAL LOW (ref 4.0–10.5)
nRBC: 0 % (ref 0.0–0.2)

## 2018-01-15 LAB — BASIC METABOLIC PANEL
Anion gap: 10 (ref 5–15)
BUN: 12 mg/dL (ref 8–23)
CO2: 21 mmol/L — ABNORMAL LOW (ref 22–32)
Calcium: 7.6 mg/dL — ABNORMAL LOW (ref 8.9–10.3)
Chloride: 108 mmol/L (ref 98–111)
Creatinine, Ser: 1.07 mg/dL — ABNORMAL HIGH (ref 0.44–1.00)
GFR calc Af Amer: >60 mL/min (ref >60)
GFR calc non Af Amer: 56 mL/min — ABNORMAL LOW (ref >60)
Glucose, Bld: 259 mg/dL — ABNORMAL HIGH (ref 70–99)
Potassium: 4.2 mmol/L (ref 3.5–5.1)
Sodium: 139 mmol/L (ref 135–145)

## 2018-01-15 LAB — INFLUENZA PANEL BY PCR (TYPE A & B)
Influenza A By PCR: NEGATIVE
Influenza B By PCR: POSITIVE — AB

## 2018-01-15 LAB — HEMOGLOBIN A1C
Hgb A1c MFr Bld: 5.6 % (ref 4.8–5.6)
Mean Plasma Glucose: 114.02 mg/dL

## 2018-01-15 LAB — PATHOLOGIST SMEAR REVIEW: Path Review: REACTIVE

## 2018-01-15 LAB — STREP PNEUMONIAE URINARY ANTIGEN: Strep Pneumo Urinary Antigen: NEGATIVE

## 2018-01-15 LAB — GLUCOSE, CAPILLARY
Glucose-Capillary: 194 mg/dL — ABNORMAL HIGH (ref 70–99)
Glucose-Capillary: 108 mg/dL — ABNORMAL HIGH (ref 70–99)
Glucose-Capillary: 120 mg/dL — ABNORMAL HIGH (ref 70–99)

## 2018-01-15 LAB — HIV ANTIBODY (ROUTINE TESTING W REFLEX): HIV Screen 4th Generation wRfx: NONREACTIVE

## 2018-01-15 MED ORDER — INSULIN ASPART 100 UNIT/ML ~~LOC~~ SOLN
0.0000 [IU] | Freq: Three times a day (TID) | SUBCUTANEOUS | Status: DC
  Administered 2018-01-16: 2 [IU] via SUBCUTANEOUS

## 2018-01-15 MED ORDER — OSELTAMIVIR PHOSPHATE 30 MG PO CAPS
30.0000 mg | ORAL_CAPSULE | Freq: Two times a day (BID) | ORAL | Status: DC
  Administered 2018-01-15 – 2018-01-17 (×5): 30 mg via ORAL
  Filled 2018-01-15 (×5): qty 1

## 2018-01-15 MED ORDER — LOPERAMIDE HCL 2 MG PO CAPS
4.0000 mg | ORAL_CAPSULE | Freq: Once | ORAL | Status: AC
  Administered 2018-01-15: 4 mg via ORAL
  Filled 2018-01-15: qty 2

## 2018-01-15 MED ORDER — IPRATROPIUM-ALBUTEROL 0.5-2.5 (3) MG/3ML IN SOLN
3.0000 mL | Freq: Three times a day (TID) | RESPIRATORY_TRACT | Status: DC
  Administered 2018-01-15 (×2): 3 mL via RESPIRATORY_TRACT
  Filled 2018-01-15 (×2): qty 3

## 2018-01-15 MED ORDER — IPRATROPIUM-ALBUTEROL 0.5-2.5 (3) MG/3ML IN SOLN
3.0000 mL | Freq: Four times a day (QID) | RESPIRATORY_TRACT | Status: DC
  Administered 2018-01-15: 3 mL via RESPIRATORY_TRACT
  Filled 2018-01-15: qty 3

## 2018-01-15 MED ORDER — CALCIUM GLUCONATE-NACL 1-0.675 GM/50ML-% IV SOLN
1.0000 g | Freq: Once | INTRAVENOUS | Status: AC
  Administered 2018-01-15: 1000 mg via INTRAVENOUS
  Filled 2018-01-15: qty 50

## 2018-01-15 MED ORDER — INSULIN ASPART 100 UNIT/ML ~~LOC~~ SOLN: 0.0000 [IU] | Freq: Every day | SUBCUTANEOUS | Status: DC

## 2018-01-15 MED ORDER — SODIUM CHLORIDE 0.9 % IV BOLUS
1000.0000 mL | Freq: Once | INTRAVENOUS | Status: AC
  Administered 2018-01-15: 1000 mL via INTRAVENOUS

## 2018-01-15 NOTE — Plan of Care (Signed)
  Problem: Activity: Goal: Risk for activity intolerance will decrease Outcome: Progressing   

## 2018-01-15 NOTE — Plan of Care (Signed)
  Problem: Coping: Goal: Level of anxiety will decrease Outcome: Completed/Met   Problem: Pain Managment: Goal: General experience of comfort will improve Outcome: Completed/Met   Problem: Safety: Goal: Ability to remain free from injury will improve Outcome: Completed/Met   Problem: Skin Integrity: Goal: Risk for impaired skin integrity will decrease Outcome: Completed/Met   

## 2018-01-15 NOTE — Progress Notes (Signed)
RT instructed pt and family on the use of flutter valve.  RN entering pt's room.

## 2018-01-15 NOTE — Progress Notes (Signed)
Pt. C/o loose stools. Pt. Denies pain, cramping. No fever noted. [facility]

## 2018-01-15 NOTE — Progress Notes (Signed)
Progress Note    Ruth Miller  WUJ:[id number] DOB: 17-Feb-1955  DOA: 01/14/2018 PCP: Cheron Schaumann., MD    Brief Narrative:     Medical records reviewed and are as summarized below:  Ruth Miller is an 62 y.o. female with medical history significant of HTN, HLD, tobacco use, and GERD; who presents with complaints of cough for approximately 4 days.  Complains of a dry cough with associated symptoms of sinus congestion, cough, sore throat left ear pain, headache, fever up to 102 F, generalized malaise sore throat, nausea, and 3-4 episodes of nonbloody diarrhea per day.  Found to have Flu B, requiring O2 to maintain oxygenation.  Assessment/Plan:   Principal Problem:   Acute respiratory failure (HCC) Active Problems:   Sepsis (HCC)   Sinusitis   Renal insufficiency   Hypokalemia   Tobacco abuse  Acute respiratory failure with hypoxia secondary to flu B -presented with fever up to 100.9 with tachypnea, leukopenia, and low blood pressures.   -per admitting MD:  physical exam appreciated sinus tenderness with left ear erythema and purulent appearing fluid behind the tympanic membrane. -tamiflu for +flu B swab - Continue empiric ceftriaxone and azithromycin-- De-escalate as able - Albuterol nebs as needed - mucinex -hold on steroids for now due to hyperglycemia, try scheduled duonebs for wheezing  Hyperglycemia -check HgbA1c -SSI  Nausea and diarrhea:  -suspect related to viral illness  Hypokalemia:  -repleted  Essential hypertension -currently hypotensive - Restart when losartan -hydrochlorothiazide when able   Hyperlipidemia - Continue simvastatin  Tobacco abuse - Counseled patient on need cessation of tobacco - Nicotine patch offered but declined by patient  obesity Body mass index is 31.64 kg/m.   Family Communication/Anticipated D/C date and plan/Code Status   DVT prophylaxis: Lovenox ordered. Code Status: Full Code.  Family Communication:   Disposition Plan: will need to remain in the hospital until O2 weaned to off and able to tolerate PO-- may take a few days in light of tobacco abuse   Medical Consultants:     Subjective:  Does not wear O2 at home Still coughing smokes  Objective:    Vitals:   01/14/18 1800 01/14/18 2020 01/14/18 2356 01/15/18 0700  BP: (!) 94/57 (!) 116/59 (!) 96/52 116/70  Pulse: 72 64 61 61  Resp: 16 18 18 18   Temp:  98.2 F (36.8 C) 98 F (36.7 C) 98.3 F (36.8 C)  TempSrc:  Oral Oral Oral  SpO2: 97% 99% 95% 96%  Weight:    73.5 kg  Height:        Intake/Output Summary (Last 24 hours) at 01/15/2018 0926 Last data filed at 01/15/2018 0815 Gross per 24 hour  Intake 3103.5 ml  Output 326 ml  Net 2777.5 ml   Filed Weights   01/14/18 1353 01/15/18 0700  Weight: 66.2 kg 73.5 kg    Exam: In bed, wearing O2-3L Expiratory wheezing rrr +BS, soft A+Ox3 Normal mood/affect  Data Reviewed:   I have personally reviewed following labs and imaging studies:  Labs: Labs show the following:   Basic Metabolic Panel: Recent Labs  Lab 01/14/18 1421 01/15/18 0315  NA 136 139  K 3.3* 4.2  CL 101 108  CO2 26 21*  GLUCOSE 98 259*  BUN 15 12  CREATININE 1.12* 1.07*  CALCIUM 8.2* 7.6*  MG 1.8  --    GFR Estimated Creatinine Clearance: 49.4 mL/min (A) (by C-G formula based on SCr of 1.07 mg/dL (H)). Liver Function Tests: Recent Labs  Lab 01/14/18 1421  AST 49*  ALT 35  ALKPHOS 113  BILITOT 0.6  PROT 7.5  ALBUMIN 3.5   No results for input(s): LIPASE, AMYLASE in the last 168 hours. No results for input(s): AMMONIA in the last 168 hours. Coagulation profile No results for input(s): INR, PROTIME in the last 168 hours.  CBC: Recent Labs  Lab 01/14/18 1421 01/15/18 0315  WBC 2.9* 1.8*  NEUTROABS 0.9*  --   HGB 13.4 12.1  HCT 42.8 39.0  MCV 96.4 96.3  PLT 150 134*   Cardiac Enzymes: Recent Labs  Lab 01/14/18 1421  TROPONINI <0.03   BNP (last 3 results) No  results for input(s): PROBNP in the last 8760 hours. CBG: No results for input(s): GLUCAP in the last 168 hours. D-Dimer: Recent Labs    01/14/18 1421  DDIMER 3.23*   Hgb A1c: No results for input(s): HGBA1C in the last 72 hours. Lipid Profile: No results for input(s): CHOL, HDL, LDLCALC, TRIG, CHOLHDL, LDLDIRECT in the last 72 hours. Thyroid function studies: No results for input(s): TSH, T4TOTAL, T3FREE, THYROIDAB in the last 72 hours.  Invalid input(s): FREET3 Anemia work up: No results for input(s): VITAMINB12, FOLATE, FERRITIN, TIBC, IRON, RETICCTPCT in the last 72 hours. Sepsis Labs: Recent Labs  Lab 01/14/18 1421 01/14/18 1510 01/15/18 0315  WBC 2.9*  --  1.8*  LATICACIDVEN  --  0.73  --     Microbiology Recent Results (from the past 240 hour(s))  Group A Strep by PCR     Status: None   Collection Time: 01/14/18  3:07 PM  Result Value Ref Range Status   Group A Strep by PCR NOT DETECTED NOT DETECTED Final    Comment: Performed at [facility], [address] [place], [place] [id number]    Procedures and diagnostic studies:  Dg Chest 2 View  Result Date: 01/14/2018 CLINICAL DATA:  Cough, congestion and shortness of breath. EXAM: CHEST - 2 VIEW COMPARISON:  05/10/2016 FINDINGS: The cardiac silhouette, mediastinal and hilar contours are normal and stable. The lungs are clear. No pleural effusion. The bony thorax is intact. IMPRESSION: No acute cardiopulmonary findings. Electronically Signed   By: Rudie MeyerP.  Gallerani M.D.   On: 01/14/2018 15:56   Ct Angio Chest Pe W/cm &/or Wo Cm  Result Date: 01/14/2018 CLINICAL DATA:  Shortness of breath, cough, body aches, chills. Hypoxia. Elevated D-dimer. EXAM: CT ANGIOGRAPHY CHEST WITH CONTRAST TECHNIQUE: Multidetector CT imaging of the chest was performed using the standard protocol during bolus administration of intravenous contrast. Multiplanar CT image reconstructions and MIPs were obtained to evaluate the vascular  anatomy. CONTRAST:  100mL ISOVUE-370 IOPAMIDOL (ISOVUE-370) INJECTION 76% COMPARISON:  Chest radiographs dated 01/14/2017. FINDINGS: Cardiovascular: Satisfactory opacification of the bilateral pulmonary arteries to the segmental level. No evidence of pulmonary embolism. No evidence of thoracic aortic aneurysm or dissection. Very mild atherosclerotic calcifications of the aortic arch and descending thoracic aorta. The heart is normal in size.  No pericardial effusion. Mediastinum/Nodes: Mild thoracic lymphadenopathy, including: --13 mm short axis high right paratracheal node (series 6/image 18) --11 mm short axis prevascular node (series 6/image 25) --14 mm short axis low right paratracheal node (series 6/image 28) --11 mm short axis right hilar node (series 6/image 31) --12 mm short axis subcarinal node (series 6/image 31) Visualized thyroid is unremarkable. Lungs/Pleura: Mild centrilobular and paraseptal emphysematous changes. No focal consolidation. Mild dependent atelectasis in the posterior right upper lobe and bilateral lower lobes. No suspicious pulmonary nodules. No  pleural effusion or pneumothorax. Upper Abdomen: Visualized upper abdomen is notable for a tiny hiatal hernia but is otherwise unremarkable. Spleen is normal in size. Musculoskeletal: Mild degenerative changes of the mid thoracic spine. Review of the MIP images confirms the above findings. IMPRESSION: No evidence of pulmonary embolism. Mild thoracic lymphadenopathy, nonspecific. This appearance may be reactive or related to a systemic process such as sarcoidosis. However, follow-up CT chest with contrast is suggested in 3-6 months to assess for progression. Aortic Atherosclerosis (ICD10-I70.0) and Emphysema (ICD10-J43.9). Electronically Signed   By: Charline Bills M.D.   On: 01/14/2018 16:59    Medications:   . enoxaparin (LOVENOX) injection  40 mg Subcutaneous Q24H  . gabapentin  100 mg Oral QHS  . nicotine  21 mg Transdermal Daily  .  oseltamivir  30 mg Oral BID  . pantoprazole  40 mg Oral Daily  . simvastatin  40 mg Oral QPM  . sodium chloride flush  3 mL Intravenous Q12H   Continuous Infusions: . sodium chloride 75 mL/hr at 01/14/18 2349  . azithromycin Stopped (01/14/18 1813)  . cefTRIAXone (ROCEPHIN)  IV Stopped (01/14/18 1813)     LOS: 0 days   Joseph Art  [facility]   *Please refer to [email or web address], password [email or web address] to get updated schedule on who will round on this patient, as hospitalists switch teams weekly. If 7PM-7AM, please contact night-coverage at [email or web address], password [email or web address] for any overnight needs.  01/15/2018, 9:26 AM

## 2018-01-16 DIAGNOSIS — R739 Hyperglycemia, unspecified: Secondary | ICD-10-CM | POA: Diagnosis present

## 2018-01-16 DIAGNOSIS — R509 Fever, unspecified: Secondary | ICD-10-CM | POA: Diagnosis present

## 2018-01-16 DIAGNOSIS — Z90710 Acquired absence of both cervix and uterus: Secondary | ICD-10-CM | POA: Diagnosis not present

## 2018-01-16 DIAGNOSIS — R6520 Severe sepsis without septic shock: Secondary | ICD-10-CM | POA: Diagnosis present

## 2018-01-16 DIAGNOSIS — E785 Hyperlipidemia, unspecified: Secondary | ICD-10-CM | POA: Diagnosis present

## 2018-01-16 DIAGNOSIS — N289 Disorder of kidney and ureter, unspecified: Secondary | ICD-10-CM | POA: Diagnosis present

## 2018-01-16 DIAGNOSIS — K219 Gastro-esophageal reflux disease without esophagitis: Secondary | ICD-10-CM | POA: Diagnosis present

## 2018-01-16 DIAGNOSIS — E876 Hypokalemia: Secondary | ICD-10-CM | POA: Diagnosis present

## 2018-01-16 DIAGNOSIS — I959 Hypotension, unspecified: Secondary | ICD-10-CM

## 2018-01-16 DIAGNOSIS — I10 Essential (primary) hypertension: Secondary | ICD-10-CM | POA: Diagnosis present

## 2018-01-16 DIAGNOSIS — E669 Obesity, unspecified: Secondary | ICD-10-CM | POA: Diagnosis present

## 2018-01-16 DIAGNOSIS — Z791 Long term (current) use of non-steroidal anti-inflammatories (NSAID): Secondary | ICD-10-CM | POA: Diagnosis not present

## 2018-01-16 DIAGNOSIS — J101 Influenza due to other identified influenza virus with other respiratory manifestations: Secondary | ICD-10-CM

## 2018-01-16 DIAGNOSIS — J019 Acute sinusitis, unspecified: Secondary | ICD-10-CM | POA: Diagnosis not present

## 2018-01-16 DIAGNOSIS — G8929 Other chronic pain: Secondary | ICD-10-CM | POA: Diagnosis present

## 2018-01-16 DIAGNOSIS — A4189 Other specified sepsis: Secondary | ICD-10-CM | POA: Diagnosis present

## 2018-01-16 DIAGNOSIS — E7800 Pure hypercholesterolemia, unspecified: Secondary | ICD-10-CM | POA: Diagnosis present

## 2018-01-16 DIAGNOSIS — Z79899 Other long term (current) drug therapy: Secondary | ICD-10-CM | POA: Diagnosis not present

## 2018-01-16 DIAGNOSIS — J9601 Acute respiratory failure with hypoxia: Secondary | ICD-10-CM | POA: Diagnosis not present

## 2018-01-16 DIAGNOSIS — Z720 Tobacco use: Secondary | ICD-10-CM | POA: Diagnosis not present

## 2018-01-16 DIAGNOSIS — Z6832 Body mass index (BMI) 32.0-32.9, adult: Secondary | ICD-10-CM | POA: Diagnosis not present

## 2018-01-16 DIAGNOSIS — T380X5A Adverse effect of glucocorticoids and synthetic analogues, initial encounter: Secondary | ICD-10-CM | POA: Diagnosis present

## 2018-01-16 DIAGNOSIS — F17210 Nicotine dependence, cigarettes, uncomplicated: Secondary | ICD-10-CM | POA: Diagnosis present

## 2018-01-16 DIAGNOSIS — D72819 Decreased white blood cell count, unspecified: Secondary | ICD-10-CM | POA: Diagnosis not present

## 2018-01-16 LAB — CBC WITH DIFFERENTIAL/PLATELET
ABS IMMATURE GRANULOCYTES: 0.19 10*3/uL — AB (ref 0.00–0.07)
Basophils Absolute: 0 10*3/uL (ref 0.0–0.1)
Basophils Relative: 0 %
Eosinophils Absolute: 0 10*3/uL (ref 0.0–0.5)
Eosinophils Relative: 0 %
HCT: 35.9 % — ABNORMAL LOW (ref 36.0–46.0)
Hemoglobin: 11.2 g/dL — ABNORMAL LOW (ref 12.0–15.0)
Immature Granulocytes: 4 %
Lymphocytes Relative: 45 %
Lymphs Abs: 2.2 10*3/uL (ref 0.7–4.0)
MCH: 30 pg (ref 26.0–34.0)
MCHC: 31.2 g/dL (ref 30.0–36.0)
MCV: 96.2 fL (ref 80.0–100.0)
Monocytes Absolute: 0.3 10*3/uL (ref 0.1–1.0)
Monocytes Relative: 6 %
Neutro Abs: 2.2 10*3/uL (ref 1.7–7.7)
Neutrophils Relative %: 45 %
Platelets: 103 10*3/uL — ABNORMAL LOW (ref 150–400)
RBC: 3.73 MIL/uL — ABNORMAL LOW (ref 3.87–5.11)
RDW: 13 % (ref 11.5–15.5)
WBC: 4.8 10*3/uL (ref 4.0–10.5)
nRBC: 0 % (ref 0.0–0.2)

## 2018-01-16 LAB — LEGIONELLA PNEUMOPHILA SEROGP 1 UR AG: L. pneumophila Serogp 1 Ur Ag: NEGATIVE

## 2018-01-16 LAB — BASIC METABOLIC PANEL
Anion gap: 7 (ref 5–15)
BUN: 8 mg/dL (ref 8–23)
CO2: 26 mmol/L (ref 22–32)
Calcium: 8 mg/dL — ABNORMAL LOW (ref 8.9–10.3)
Chloride: 111 mmol/L (ref 98–111)
Creatinine, Ser: 1.01 mg/dL — ABNORMAL HIGH (ref 0.44–1.00)
GFR calc Af Amer: >60 mL/min (ref >60)
GFR calc non Af Amer: >60 mL/min (ref >60)
GLUCOSE: 95 mg/dL (ref 70–99)
Potassium: 3.9 mmol/L (ref 3.5–5.1)
Sodium: 144 mmol/L (ref 135–145)

## 2018-01-16 LAB — GLUCOSE, CAPILLARY
Glucose-Capillary: 199 mg/dL — ABNORMAL HIGH (ref 70–99)
Glucose-Capillary: 138 mg/dL — ABNORMAL HIGH (ref 70–99)

## 2018-01-16 MED ORDER — RISAQUAD PO CAPS
1.0000 | ORAL_CAPSULE | Freq: Every day | ORAL | Status: DC
  Administered 2018-01-16 – 2018-01-17 (×2): 1 via ORAL
  Filled 2018-01-16 (×2): qty 1

## 2018-01-16 MED ORDER — GUAIFENESIN ER 600 MG PO TB12
600.0000 mg | ORAL_TABLET | Freq: Two times a day (BID) | ORAL | Status: DC
  Administered 2018-01-16 – 2018-01-17 (×3): 600 mg via ORAL
  Filled 2018-01-16 (×3): qty 1

## 2018-01-16 MED ORDER — SODIUM CHLORIDE 0.9 % IV SOLN
INTRAVENOUS | Status: DC
  Administered 2018-01-16 – 2018-01-17 (×2): via INTRAVENOUS

## 2018-01-16 MED ORDER — NAPROXEN 250 MG PO TABS
500.0000 mg | ORAL_TABLET | Freq: Two times a day (BID) | ORAL | Status: DC
  Administered 2018-01-16 – 2018-01-17 (×2): 500 mg via ORAL
  Filled 2018-01-16 (×3): qty 2

## 2018-01-16 NOTE — Progress Notes (Addendum)
Progress Note    Ruth PennerMary Miller  WUJ:[id number] DOB: 08/10/1955  DOA: 01/14/2018 PCP: Cheron SchaumannVelazquez, Gretchen Y., MD    Brief Narrative:     Medical records reviewed and are as summarized below:  Ruth PennerMary Miller is an 62 y.o. female with medical history significant of HTN, HLD, tobacco use, and GERD; who presents with complaints of cough for approximately 4 days.  Complains of a dry cough with associated symptoms of sinus congestion, cough, sore throat left ear pain, headache, fever up to 102 F, generalized malaise sore throat, nausea, and 3-4 episodes of nonbloody diarrhea per day.  Found to have Flu B, requiring O2 to maintain oxygenation initially.  Assessment/Plan:   Principal Problem:   Acute respiratory failure (HCC) Active Problems:   Sinusitis   Hypokalemia   Tobacco abuse   Influenza B  Acute respiratory failure with hypoxia secondary to flu B -presented with fever up to 100.9 with tachypnea, leukopenia, and low blood pressures.   -per admitting MD:  physical exam appreciated sinus tenderness with left ear erythema and purulent appearing fluid behind the tympanic membrane. -tamiflu for +flu B swab - d/c IV abx for now - Albuterol nebs as needed - mucinex  -pulmonary toilet -hold on steroids for now due to hyperglycemia, try scheduled duonebs for wheezing -home O2 study in AM  Hyperglycemia -HgbA1c: 5.6 -due to IV steroids in ER  Nausea and diarrhea:  -suspect related to viral illness vs tamiflu -IVF to maintain hydration as do not thinks she can take in enough PO fluids to maintain hydration -added lactobacillus  Hypokalemia:  -repleted  Essential hypertension -currently hypotensive - Restart when losartan -hydrochlorothiazide when able  Hyperlipidemia - Continue simvastatin  Tobacco abuse - Counseled patient on need cessation of tobacco - Nicotine patch offered but declined by patient  Thrombocytopenia -suspect dilutional -d/c  lovenox -scds  obesity Body mass index is 32.52 kg/m.  Outpatient follow up: Mild thoracic lymphadenopathy, nonspecific. This appearance may be reactive or related to a systemic process such as sarcoidosis.  However, follow-up CT chest with contrast is suggested in 3-6 months to assess for progression.    Family Communication/Anticipated D/C date and plan/Code Status   DVT prophylaxis: Lovenox ordered. Code Status: Full Code.  Family Communication:  Disposition Plan: will need to remain in the hospital until able to tolerate PO-- may take a few days in light of tobacco abuse   Medical Consultants:     Subjective:  C/o to ache all over Not eating much Having "diarrhea"  Objective:    Vitals:   01/15/18 2100 01/16/18 0622 01/16/18 0805 01/16/18 1140  BP:  126/67  116/71  Pulse:  65  (!) 58  Resp:  18  18  Temp: 97.8 F (36.6 C) 98.1 F (36.7 C)  98.4 F (36.9 C)  TempSrc: Oral Oral  Oral  SpO2:  93% 94%   Weight:  75.5 kg    Height:        Intake/Output Summary (Last 24 hours) at 01/16/2018 1257 Last data filed at 01/16/2018 0801 Gross per 24 hour  Intake 1522.38 ml  Output 301 ml  Net 1221.38 ml   Filed Weights   01/14/18 1353 01/15/18 0700 01/16/18 0622  Weight: 66.2 kg 73.5 kg 75.5 kg    Exam: In bed, NAD rrr Coarse breath sounds but some improvement with cough, appears to be breathing more comfortably No LE edema No rashes or lesions  Data Reviewed:   I have personally reviewed following  labs and imaging studies:  Labs: Labs show the following:   Basic Metabolic Panel: Recent Labs  Lab 01/14/18 1421 01/15/18 0315 01/16/18 0506  NA 136 139 144  K 3.3* 4.2 3.9  CL 101 108 111  CO2 26 21* 26  GLUCOSE 98 259* 95  BUN 15 12 8   CREATININE 1.12* 1.07* 1.01*  CALCIUM 8.2* 7.6* 8.0*  MG 1.8  --   --    GFR Estimated Creatinine Clearance: 53.1 mL/min (A) (by C-G formula based on SCr of 1.01 mg/dL (H)). Liver Function Tests: Recent Labs   Lab 01/14/18 1421  AST 49*  ALT 35  ALKPHOS 113  BILITOT 0.6  PROT 7.5  ALBUMIN 3.5   No results for input(s): LIPASE, AMYLASE in the last 168 hours. No results for input(s): AMMONIA in the last 168 hours. Coagulation profile No results for input(s): INR, PROTIME in the last 168 hours.  CBC: Recent Labs  Lab 01/14/18 1421 01/15/18 0315 01/16/18 0506  WBC 2.9* 1.8* 4.8  NEUTROABS 0.9*  --  2.2  HGB 13.4 12.1 11.2*  HCT 42.8 39.0 35.9*  MCV 96.4 96.3 96.2  PLT 150 134* 103*   Cardiac Enzymes: Recent Labs  Lab 01/14/18 1421  TROPONINI <0.03   BNP (last 3 results) No results for input(s): PROBNP in the last 8760 hours. CBG: Recent Labs  Lab 01/15/18 1132 01/15/18 1654 01/15/18 2042 01/16/18 0724 01/16/18 1138  GLUCAP 194* 108* 120* 199* 138*   D-Dimer: Recent Labs    01/14/18 1421  DDIMER 3.23*   Hgb A1c: Recent Labs    01/15/18 0315  HGBA1C 5.6   Lipid Profile: No results for input(s): CHOL, HDL, LDLCALC, TRIG, CHOLHDL, LDLDIRECT in the last 72 hours. Thyroid function studies: No results for input(s): TSH, T4TOTAL, T3FREE, THYROIDAB in the last 72 hours.  Invalid input(s): FREET3 Anemia work up: No results for input(s): VITAMINB12, FOLATE, FERRITIN, TIBC, IRON, RETICCTPCT in the last 72 hours.  Recent Labs  Lab 01/14/18 1421 01/14/18 1510 01/15/18 0315 01/16/18 0506  WBC 2.9*  --  1.8* 4.8  LATICACIDVEN  --  0.73  --   --     Microbiology Recent Results (from the past 240 hour(s))  Group A Strep by PCR     Status: None   Collection Time: 01/14/18  3:07 PM  Result Value Ref Range Status   Group A Strep by PCR NOT DETECTED NOT DETECTED Final    Comment: Performed at [facility], [address] [place], [place] [address]    Procedures and diagnostic studies:  Dg Chest 2 View  Result Date: 01/14/2018 CLINICAL DATA:  Cough, congestion and shortness of breath. EXAM: CHEST - 2 VIEW COMPARISON:  05/10/2016 FINDINGS: The  cardiac silhouette, mediastinal and hilar contours are normal and stable. The lungs are clear. No pleural effusion. The bony thorax is intact. IMPRESSION: No acute cardiopulmonary findings. Electronically Signed   By: Rudie Meyer M.D.   On: 01/14/2018 15:56   Ct Angio Chest Pe W/cm &/or Wo Cm  Result Date: 01/14/2018 CLINICAL DATA:  Shortness of breath, cough, body aches, chills. Hypoxia. Elevated D-dimer. EXAM: CT ANGIOGRAPHY CHEST WITH CONTRAST TECHNIQUE: Multidetector CT imaging of the chest was performed using the standard protocol during bolus administration of intravenous contrast. Multiplanar CT image reconstructions and MIPs were obtained to evaluate the vascular anatomy. CONTRAST:  ISOVUE-370 IOPAMIDOL (ISOVUE-370) INJECTION 76% COMPARISON:  Chest radiographs dated 01/14/2017. FINDINGS: Cardiovascular: Satisfactory opacification of the bilateral pulmonary  arteries to the segmental level. No evidence of pulmonary embolism. No evidence of thoracic aortic aneurysm or dissection. Very mild atherosclerotic calcifications of the aortic arch and descending thoracic aorta. The heart is normal in size.  No pericardial effusion. Mediastinum/Nodes: Mild thoracic lymphadenopathy, including: --13 mm short axis high right paratracheal node (series 6/image 18) --11 mm short axis prevascular node (series 6/image 25) --14 mm short axis low right paratracheal node (series 6/image 28) --11 mm short axis right hilar node (series 6/image 31) --12 mm short axis subcarinal node (series 6/image 31) Visualized thyroid is unremarkable. Lungs/Pleura: Mild centrilobular and paraseptal emphysematous changes. No focal consolidation. Mild dependent atelectasis in the posterior right upper lobe and bilateral lower lobes. No suspicious pulmonary nodules. No pleural effusion or pneumothorax. Upper Abdomen: Visualized upper abdomen is notable for a tiny hiatal hernia but is otherwise unremarkable. Spleen is normal in size.  Musculoskeletal: Mild degenerative changes of the mid thoracic spine. Review of the MIP images confirms the above findings. IMPRESSION: No evidence of pulmonary embolism. Mild thoracic lymphadenopathy, nonspecific. This appearance may be reactive or related to a systemic process such as sarcoidosis. However, follow-up CT chest with contrast is suggested in 3-6 months to assess for progression. Aortic Atherosclerosis (ICD10-I70.0) and Emphysema (ICD10-J43.9). Electronically Signed   By: Charline Bills M.D.   On: 01/14/2018 16:59    Medications:   . acidophilus  1 capsule Oral Daily  . enoxaparin (LOVENOX) injection  40 mg Subcutaneous Q24H  . gabapentin  100 mg Oral QHS  . guaiFENesin  600 mg Oral BID  . insulin aspart  0-5 Units Subcutaneous QHS  . insulin aspart  0-9 Units Subcutaneous TID WC  . nicotine  21 mg Transdermal Daily  . oseltamivir  30 mg Oral BID  . pantoprazole  40 mg Oral Daily  . simvastatin  40 mg Oral QPM  . sodium chloride flush  3 mL Intravenous Q12H   Continuous Infusions: . sodium chloride       LOS: 0 days   Joseph Art  [facility]   *Please refer to [email or web address], password [email or web address] to get updated schedule on who will round on this patient, as hospitalists switch teams weekly. If 7PM-7AM, please contact night-coverage at [email or web address], password [email or web address] for any overnight needs.  01/16/2018, 12:57 PM

## 2018-01-17 DIAGNOSIS — R590 Localized enlarged lymph nodes: Secondary | ICD-10-CM

## 2018-01-17 LAB — BASIC METABOLIC PANEL
Anion gap: 7 (ref 5–15)
BUN: 13 mg/dL (ref 8–23)
CHLORIDE: 113 mmol/L — AB (ref 98–111)
CO2: 23 mmol/L (ref 22–32)
Calcium: 7.9 mg/dL — ABNORMAL LOW (ref 8.9–10.3)
Creatinine, Ser: 0.95 mg/dL (ref 0.44–1.00)
GFR calc Af Amer: >60 mL/min (ref >60)
GFR calc non Af Amer: >60 mL/min (ref >60)
Glucose, Bld: 89 mg/dL (ref 70–99)
POTASSIUM: 4.2 mmol/L (ref 3.5–5.1)
Sodium: 143 mmol/L (ref 135–145)

## 2018-01-17 LAB — CBC
HCT: 35.9 % — ABNORMAL LOW (ref 36.0–46.0)
Hemoglobin: 11.1 g/dL — ABNORMAL LOW (ref 12.0–15.0)
MCH: 29.9 pg (ref 26.0–34.0)
MCHC: 30.9 g/dL (ref 30.0–36.0)
MCV: 96.8 fL (ref 80.0–100.0)
Platelets: 101 10*3/uL — ABNORMAL LOW (ref 150–400)
RBC: 3.71 MIL/uL — ABNORMAL LOW (ref 3.87–5.11)
RDW: 12.9 % (ref 11.5–15.5)
WBC: 4.2 10*3/uL (ref 4.0–10.5)
nRBC: 0 % (ref 0.0–0.2)

## 2018-01-17 MED ORDER — ALBUTEROL SULFATE HFA 108 (90 BASE) MCG/ACT IN AERS: 2.0000 | INHALATION_SPRAY | Freq: Four times a day (QID) | RESPIRATORY_TRACT | 0 refills | Status: AC | PRN

## 2018-01-17 MED ORDER — OSELTAMIVIR PHOSPHATE 30 MG PO CAPS: 30.0000 mg | ORAL_CAPSULE | Freq: Two times a day (BID) | ORAL | 0 refills | Status: AC

## 2018-01-17 MED FILL — OSELTAMIVIR PHOSPHATE 30 MG: 30 | 4 days supply | Qty: 8 | Fill #0 | Status: TO

## 2018-01-17 NOTE — Plan of Care (Signed)

## 2018-01-17 NOTE — Discharge Summary (Signed)
Physician Discharge Summary  Ruth Miller ZOX:[id number] DOB: 1955-12-02 DOA: 01/14/2018  PCP: Cheron Schaumann., MD  Admit date: 01/14/2018 Discharge date: 01/17/2018  Admitted From: Home Disposition: Home  Recommendations for Outpatient Follow-up:  1. Follow up with PCP in 1-2 weeks.  Repeat CT of the chest with contrast in about 3 to 6 months to ensure improvement in the lymphadenopathy or other intrathoracic reactive process. 2. Please obtain BMP/CBC in one week your next doctors visit.  3. Albuterol inhaler prescribed 4. Tamiflu orally twice daily for 4 more days  Discharge Condition: Stable CODE STATUS: Full code Diet recommendation: Diabetic diet  Brief/Interim Summary: 62 year old with essential hypertension, hyperlipidemia, tobacco use and GERD came to the hospital with complaints of cough and shortness of breath.  She was also noted to be febrile.  During the hospitalization she was diagnosed with influenza B therefore started on Tamiflu.  She was also given supportive care with the breathing treatments and supplemental oxygen.  Over the course of 48 hours she was weaned off the oxygen and did well.  She was ambulating in the hallway without any issues and felt symptomatically much better.  She was able to tolerate oral diet without any issues. Today patient wants to go home as she is feeling much better.  She is reached maximal benefit from hospital stay therefore we will discharge her.   Discharge Diagnoses:  Principal Problem:   Acute respiratory failure (HCC) Active Problems:   Sinusitis   Hypokalemia   Tobacco abuse   Influenza B  Acute respiratory distress with hypoxia, improved Influenza B - We will discharge on 4 more days of oral Tamiflu, advise routine supportive care treatment.  Also given prescription for albuterol inhaler as needed.  No longer requiring oxygen at this time and tolerating oral diet.  She has been educated on routine supportive care  treatments. -CT scan was negative for any pulmonary embolism but showed reactive lymphadenopathy, she has been advised to repeat the scan in about 3-6 months to ensure improvement in this.  Essential hypertension -Resume home medication losartan and hydrochlorothiazide  Hyperlipidemia -Continue simvastatin  Tobacco use -Counseled to quit using this.  Nicotine patch offered but patient refused  She was on Lovenox for DVT prophylaxis Full code Stable for discharge.  Discharge Instructions   Allergies as of 01/17/2018      Reactions   Escitalopram Oxalate Nausea Only      Medication List    TAKE these medications   albuterol 108 (90 Base) MCG/ACT inhaler Commonly known as:  PROVENTIL HFA;VENTOLIN HFA Inhale 2 puffs into the lungs every 6 (six) hours as needed for wheezing or shortness of breath.   gabapentin 100 MG capsule Commonly known as:  NEURONTIN Take 100 mg by mouth at bedtime.   losartan-hydrochlorothiazide 100-12.5 MG tablet Commonly known as:  HYZAAR Take 1 tablet by mouth daily.   oseltamivir 30 MG capsule Commonly known as:  TAMIFLU Take 1 capsule (30 mg total) by mouth 2 (two) times daily for 8 doses.   pantoprazole 40 MG tablet Commonly known as:  PROTONIX Take 40 mg by mouth daily.   simvastatin 40 MG tablet Commonly known as:  ZOCOR Take 40 mg by mouth every evening.      Follow-up Information    Velazquez, Vira Browns., MD. Schedule an appointment as soon as possible for a visit in 1 week(s).   Specialty:  Internal Medicine Contact information: [address] [facility] INTERNAL MEDICINE [place] [address]  Allergies  Allergen Reactions  . Escitalopram Oxalate Nausea Only    You were cared for by a hospitalist during your hospital stay. If you have any questions about your discharge medications or the care you received while you were in the hospital after you are discharged, you can call the unit and asked to speak with the  hospitalist on call if the hospitalist that took care of you is not available. Once you are discharged, your primary care physician will handle any further medical issues. Please note that no refills for any discharge medications will be authorized once you are discharged, as it is imperative that you return to your primary care physician (or establish a relationship with a primary care physician if you do not have one) for your aftercare needs so that they can reassess your need for medications and monitor your lab values.  Consultations:  None   Procedures/Studies: Dg Chest 2 View  Result Date: 01/14/2018 CLINICAL DATA:  Cough, congestion and shortness of breath. EXAM: CHEST - 2 VIEW COMPARISON:  05/10/2016 FINDINGS: The cardiac silhouette, mediastinal and hilar contours are normal and stable. The lungs are clear. No pleural effusion. The bony thorax is intact. IMPRESSION: No acute cardiopulmonary findings. Electronically Signed   By: Rudie Meyer M.D.   On: 01/14/2018 15:56   Ct Angio Chest Pe W/cm &/or Wo Cm  Result Date: 01/14/2018 CLINICAL DATA:  Shortness of breath, cough, body aches, chills. Hypoxia. Elevated D-dimer. EXAM: CT ANGIOGRAPHY CHEST WITH CONTRAST TECHNIQUE: Multidetector CT imaging of the chest was performed using the standard protocol during bolus administration of intravenous contrast. Multiplanar CT image reconstructions and MIPs were obtained to evaluate the vascular anatomy. CONTRAST:  ISOVUE-370 IOPAMIDOL (ISOVUE-370) INJECTION 76% COMPARISON:  Chest radiographs dated 01/14/2017. FINDINGS: Cardiovascular: Satisfactory opacification of the bilateral pulmonary arteries to the segmental level. No evidence of pulmonary embolism. No evidence of thoracic aortic aneurysm or dissection. Very mild atherosclerotic calcifications of the aortic arch and descending thoracic aorta. The heart is normal in size.  No pericardial effusion. Mediastinum/Nodes: Mild thoracic  lymphadenopathy, including: --13 mm short axis high right paratracheal node (series 6/image 18) --11 mm short axis prevascular node (series 6/image 25) --14 mm short axis low right paratracheal node (series 6/image 28) --11 mm short axis right hilar node (series 6/image 31) --12 mm short axis subcarinal node (series 6/image 31) Visualized thyroid is unremarkable. Lungs/Pleura: Mild centrilobular and paraseptal emphysematous changes. No focal consolidation. Mild dependent atelectasis in the posterior right upper lobe and bilateral lower lobes. No suspicious pulmonary nodules. No pleural effusion or pneumothorax. Upper Abdomen: Visualized upper abdomen is notable for a tiny hiatal hernia but is otherwise unremarkable. Spleen is normal in size. Musculoskeletal: Mild degenerative changes of the mid thoracic spine. Review of the MIP images confirms the above findings. IMPRESSION: No evidence of pulmonary embolism. Mild thoracic lymphadenopathy, nonspecific. This appearance may be reactive or related to a systemic process such as sarcoidosis. However, follow-up CT chest with contrast is suggested in 3-6 months to assess for progression. Aortic Atherosclerosis (ICD10-I70.0) and Emphysema (ICD10-J43.9). Electronically Signed   By: Charline Bills M.D.   On: 01/14/2018 16:59      Subjective: No complaints, her breathing is significantly improved.  Wishes to go home.     Discharge Exam: Vitals:   01/17/18 0539 01/17/18 0550  BP: 127/74   Pulse: (!) 56   Resp: 18   Temp: 97.7 F (36.5 C)   SpO2: (!) 88% 93%   Vitals:  01/16/18 1140 01/16/18 1945 01/17/18 0539 01/17/18 0550  BP: 116/71 129/69 127/74   Pulse: (!) 58 60 (!) 56   Resp: 18 18 18    Temp: 98.4 F (36.9 C) 97.9 F (36.6 C) 97.7 F (36.5 C)   TempSrc: Oral Oral Oral   SpO2:  92% (!) 88% 93%  Weight:   76.6 kg   Height:        General: Pt is alert, awake, not in acute distress Cardiovascular: RRR, S1/S2 +, no rubs, no  gallops Respiratory: CTA bilaterally, no wheezing, no rhonchi Abdominal: Soft, NT, ND, bowel sounds + Extremities: no edema, no cyanosis    The results of significant diagnostics from this hospitalization (including imaging, microbiology, ancillary and laboratory) are listed below for reference.     Microbiology: Recent Results (from the past 240 hour(s))  Group A Strep by PCR     Status: None   Collection Time: 01/14/18  3:07 PM  Result Value Ref Range Status   Group A Strep by PCR NOT DETECTED NOT DETECTED Final    Comment: Performed at [facility] [place], [address] [place], [place] [address]  Blood Culture (routine x 2)     Status: None (Preliminary result)   Collection Time: 01/14/18  4:25 PM  Result Value Ref Range Status   Specimen Description   Final    BLOOD RIGHT ANTECUBITAL Performed at [facility] [place], [address] [place], [place] [address]    Special Requests   Final    BOTTLES DRAWN AEROBIC AND ANAEROBIC Blood Culture adequate volume Performed at [facility] [place], [address] [place], [place] [address]    Culture   Final    NO GROWTH 1 DAY Performed at [facility], [address] [place], [place] [address]    Report Status PENDING  Incomplete  Blood Culture (routine x 2)     Status: None (Preliminary result)   Collection Time: 01/14/18  4:37 PM  Result Value Ref Range Status   Specimen Description   Final    BLOOD BLOOD RIGHT HAND Performed at [facility] [place], [address] [place], [place] [address]    Special Requests   Final    BOTTLES DRAWN AEROBIC AND ANAEROBIC Blood Culture adequate volume Performed at [facility] [place], [address] [place], [place] [address]    Culture   Final    NO GROWTH 1 DAY Performed at [facility], [address] [place], [place] [address]    Report Status PENDING  Incomplete     Labs: BNP (last 3 results) No results for input(s): BNP in the last 8760  hours. Basic Metabolic Panel: Recent Labs  Lab 01/14/18 1421 01/15/18 0315 01/16/18 0506 01/17/18 0520  NA 136 139 144 143  K 3.3* 4.2 3.9 4.2  CL 101 108 111 113*  CO2 26 21* 26 23  GLUCOSE 98 259* 95 89  BUN 15 12 8 13   CREATININE 1.12* 1.07* 1.01* 0.95  CALCIUM 8.2* 7.6* 8.0* 7.9*  MG 1.8  --   --   --    Liver Function Tests: Recent Labs  Lab 01/14/18 1421  AST 49*  ALT 35  ALKPHOS 113  BILITOT 0.6  PROT 7.5  ALBUMIN 3.5   No results for input(s): LIPASE, AMYLASE in the last 168 hours. No results for input(s): AMMONIA in the last 168 hours. CBC: Recent Labs  Lab  01/14/18 1421 01/15/18 0315 01/16/18 0506 01/17/18 0520  WBC 2.9* 1.8* 4.8 4.2  NEUTROABS 0.9*  --  2.2  --   HGB 13.4 12.1 11.2* 11.1*  HCT 42.8 39.0 35.9* 35.9*  MCV 96.4 96.3 96.2 96.8  PLT 150 134* 103* 101*   Cardiac Enzymes: Recent Labs  Lab 01/14/18 1421  TROPONINI <0.03   BNP: Invalid input(s): POCBNP CBG: Recent Labs  Lab 01/15/18 1132 01/15/18 1654 01/15/18 2042 01/16/18 0724 01/16/18 1138  GLUCAP 194* 108* 120* 199* 138*   D-Dimer Recent Labs    01/14/18 1421  DDIMER 3.23*   Hgb A1c Recent Labs    01/15/18 0315  HGBA1C 5.6   Lipid Profile No results for input(s): CHOL, HDL, LDLCALC, TRIG, CHOLHDL, LDLDIRECT in the last 72 hours. Thyroid function studies No results for input(s): TSH, T4TOTAL, T3FREE, THYROIDAB in the last 72 hours.  Invalid input(s): FREET3 Anemia work up No results for input(s): VITAMINB12, FOLATE, FERRITIN, TIBC, IRON, RETICCTPCT in the last 72 hours. Urinalysis    Component Value Date/Time   COLORURINE YELLOW 01/14/2018 2257   APPEARANCEUR CLEAR 01/14/2018 2257   LABSPEC >1.046 (H) 01/14/2018 2257   PHURINE 5.0 01/14/2018 2257   GLUCOSEU NEGATIVE 01/14/2018 2257   HGBUR NEGATIVE 01/14/2018 2257   BILIRUBINUR NEGATIVE 01/14/2018 2257   KETONESUR NEGATIVE 01/14/2018 2257   PROTEINUR NEGATIVE 01/14/2018 2257   NITRITE NEGATIVE  01/14/2018 2257   LEUKOCYTESUR NEGATIVE 01/14/2018 2257   Sepsis Labs Invalid input(s): PROCALCITONIN,  WBC,  LACTICIDVEN Microbiology Recent Results (from the past 240 hour(s))  Group A Strep by PCR     Status: None   Collection Time: 01/14/18  3:07 PM  Result Value Ref Range Status   Group A Strep by PCR NOT DETECTED NOT DETECTED Final    Comment: Performed at [facility], [address] [place], [place] [address]  Blood Culture (routine x 2)     Status: None (Preliminary result)   Collection Time: 01/14/18  4:25 PM  Result Value Ref Range Status   Specimen Description   Final    BLOOD RIGHT ANTECUBITAL Performed at [facility], [address] [place], [place] [address]    Special Requests   Final    BOTTLES DRAWN AEROBIC AND ANAEROBIC Blood Culture adequate volume Performed at [facility], [address] [place], [place] [address]    Culture   Final    NO GROWTH 1 DAY Performed at [facility], [address] [place], [place] [address]    Report Status PENDING  Incomplete  Blood Culture (routine x 2)     Status: None (Preliminary result)   Collection Time: 01/14/18  4:37 PM  Result Value Ref Range Status   Specimen Description   Final    BLOOD BLOOD RIGHT HAND Performed at [facility], [address] [place], [place] [address]    Special Requests   Final    BOTTLES DRAWN AEROBIC AND ANAEROBIC Blood Culture adequate volume Performed at [facility], [address], [place], [place] [address]    Culture   Final    NO GROWTH 1 DAY Performed at [facility], [address] [place], [place] [address]    Report Status PENDING  Incomplete     Time coordinating discharge:  I have spent 35 minutes face to face with the patient and on the ward discussing the patients care, assessment,  plan and disposition with other care givers. >50% of the time was devoted counseling the patient about the risks and  benefits of treatment/Discharge disposition and coordinating care.   SIGNED:   Dimple Nanas, MD  [facility] 01/17/2018, 11:30 AM Pager   If 7PM-7AM, please contact night-coverage [email or web address] Password [email or web address]

## 2018-01-20 LAB — CULTURE, BLOOD (ROUTINE X 2)
Culture: NO GROWTH
Culture: NO GROWTH
SPECIAL REQUESTS: ADEQUATE
Special Requests: ADEQUATE

## 2019-06-26 IMAGING — CT CT ANGIO CHEST
2 of 8 series · 18 of 36 positions shown · IV contrast (iopamidol)
Comparison: Chest radiographs dated 01/14/2017.

CLINICAL DATA: Shortness of breath, cough, body aches, chills.
Hypoxia. Elevated D-dimer.

EXAM:
CT ANGIOGRAPHY CHEST WITH CONTRAST
TECHNIQUE: Multidetector CT imaging of the chest was performed using the
standard protocol during bolus administration of intravenous
contrast. Multiplanar CT image reconstructions and MIPs were
obtained to evaluate the vascular anatomy.
CONTRAST:  100mL [id number] IOPAMIDOL ([id number]) INJECTION 76%

[Series 8: pe thins · axial · 0.71mm/px · z∈[+959,+1178]mm · 17 of 245 slices shown]
[im 13/245  lung]
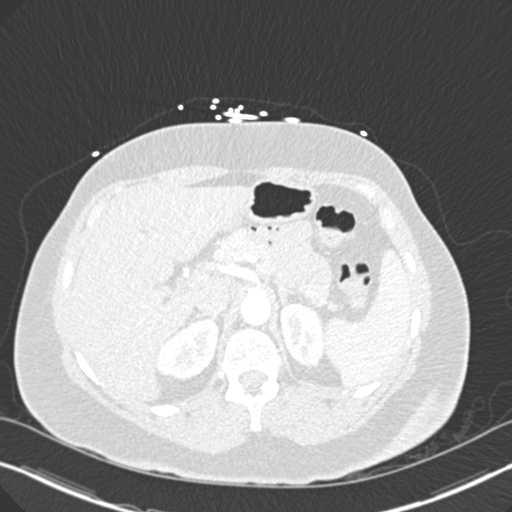
[im 26/245  mediastinal]
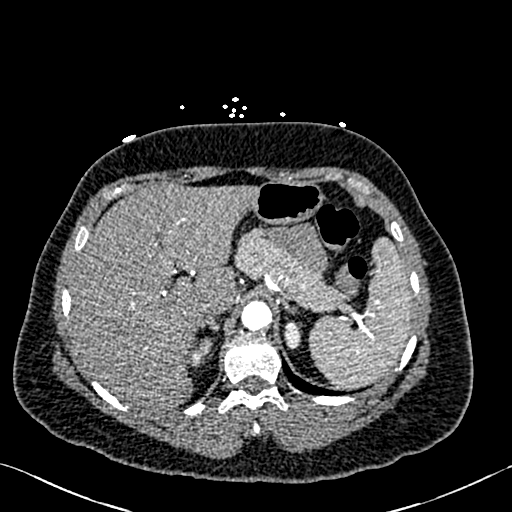
[im 39/245  lung]
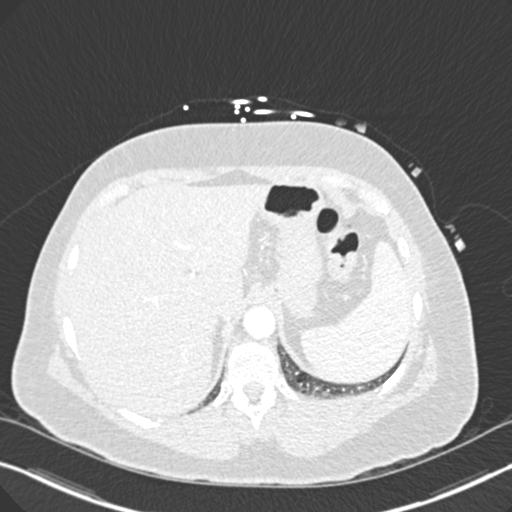
[im 52/245  mediastinal]
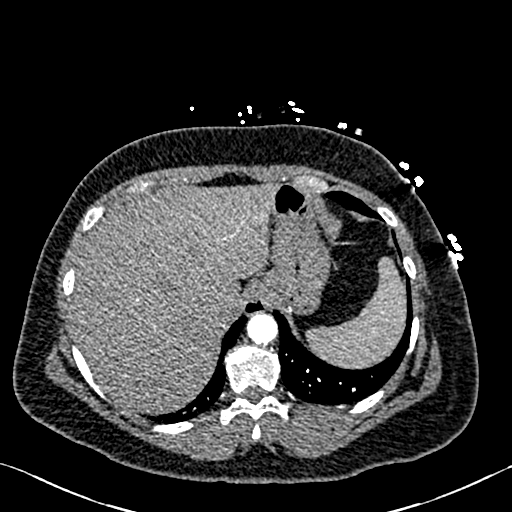
[im 65/245  lung]
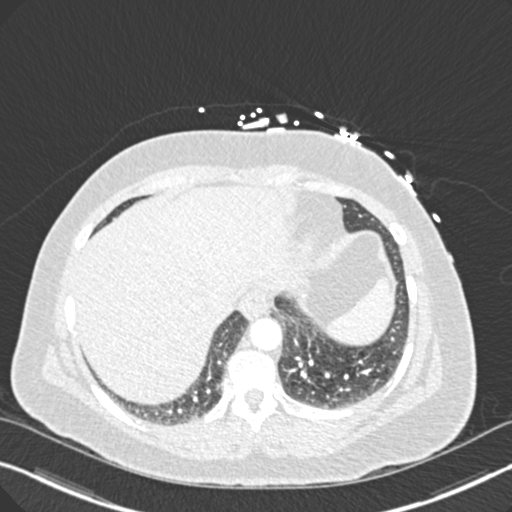
[im 78/245  mediastinal]
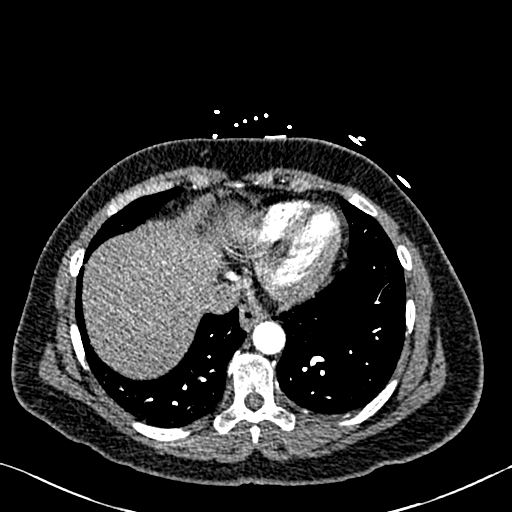
[im 90/245  lung]
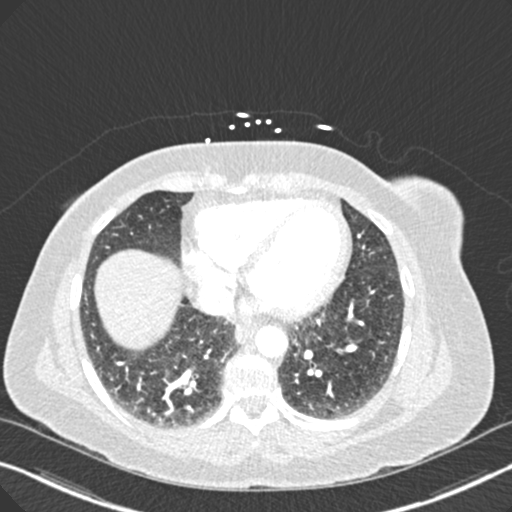
[im 103/245  mediastinal]
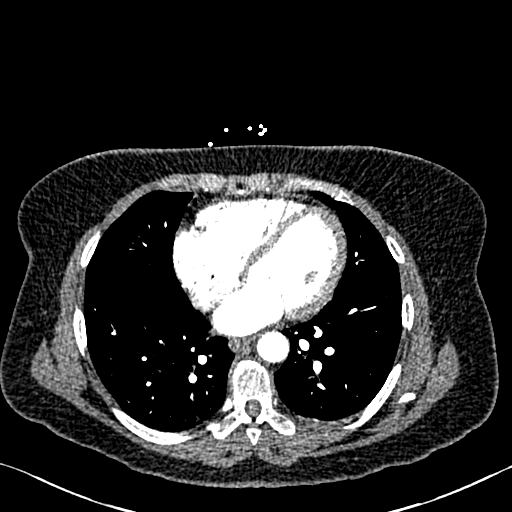
[im 129/245  lung]
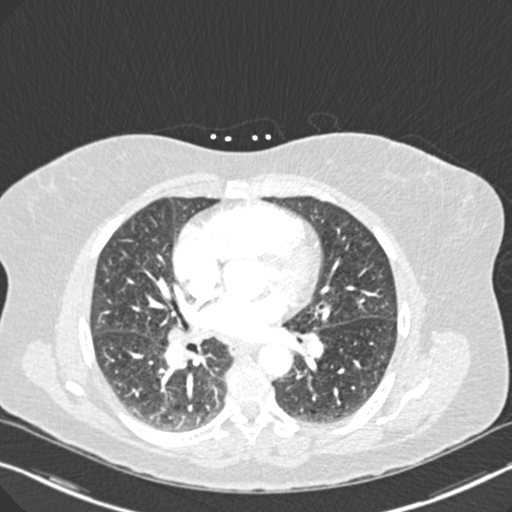
[im 142/245  mediastinal]
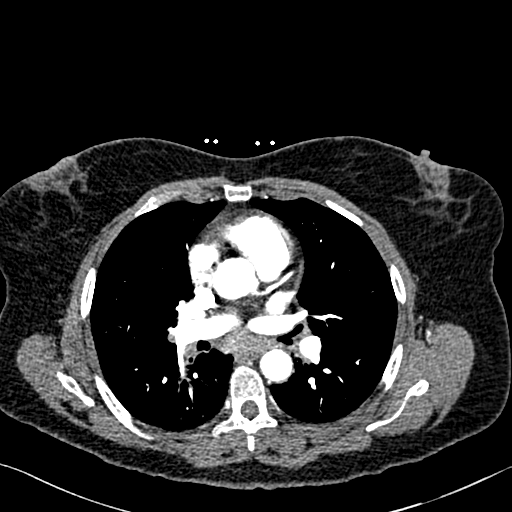
[im 155/245  lung]
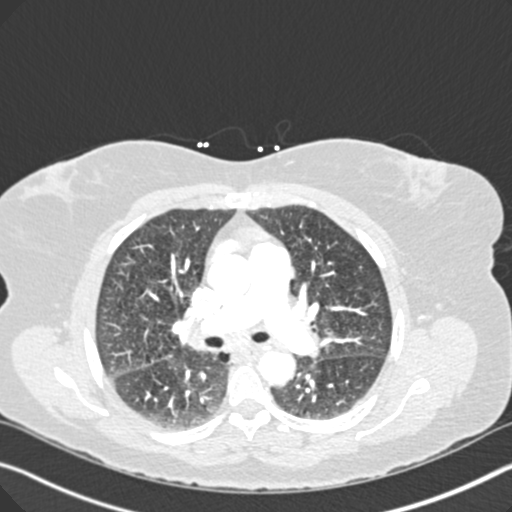
[im 167/245  mediastinal]
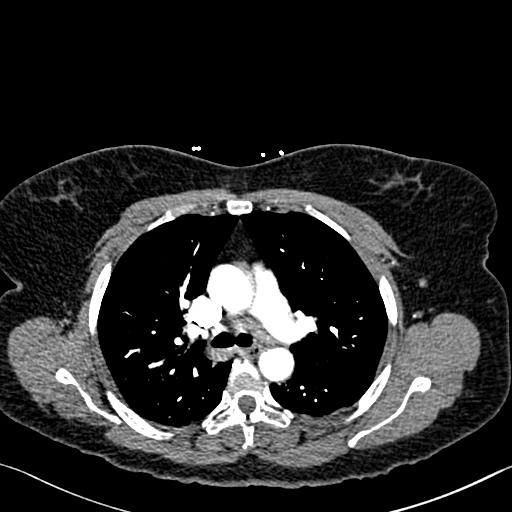
[im 180/245  lung]
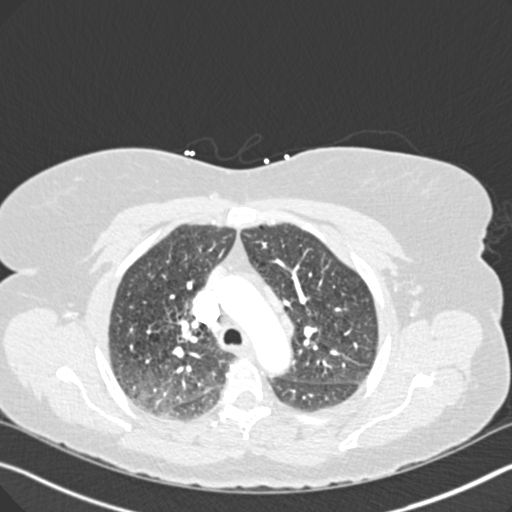
[im 193/245  mediastinal]
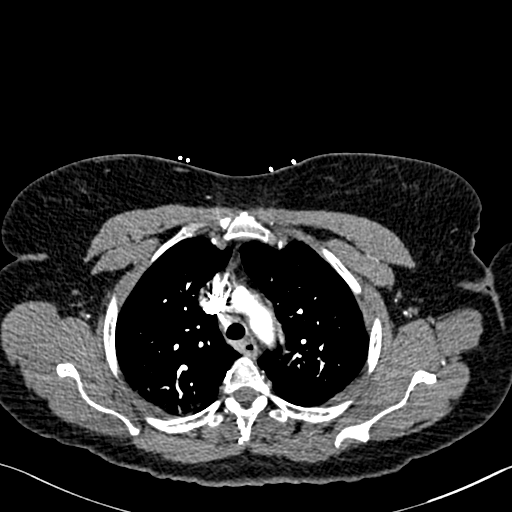
[im 206/245  lung]
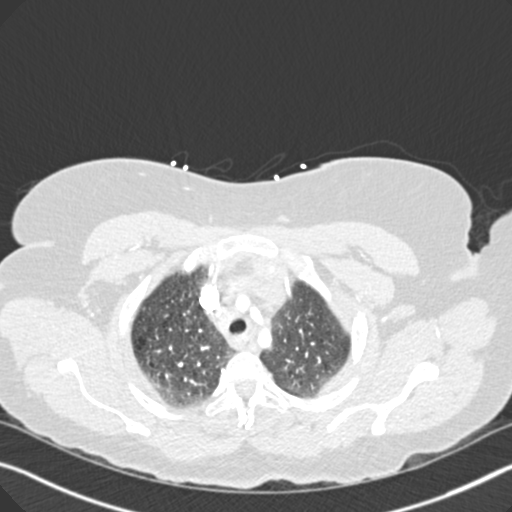
[im 219/245  mediastinal]
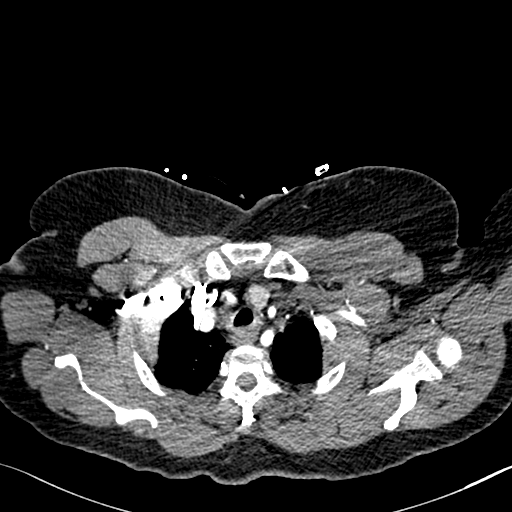
[im 232/245  lung]
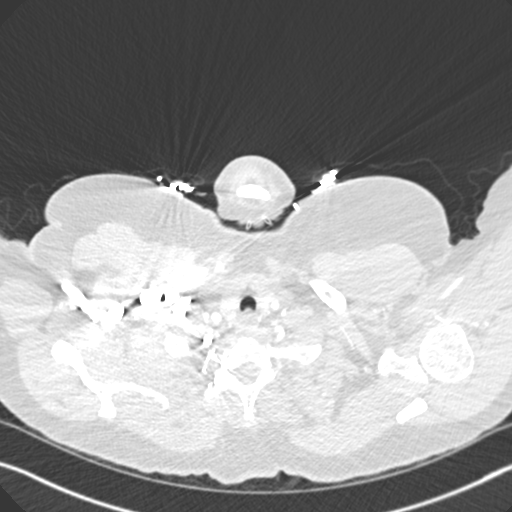

[Series 9: pe coronal mpr · coronal · 0.50mm/px · 1 of 126 slices shown]
[im 63/126  mediastinal]
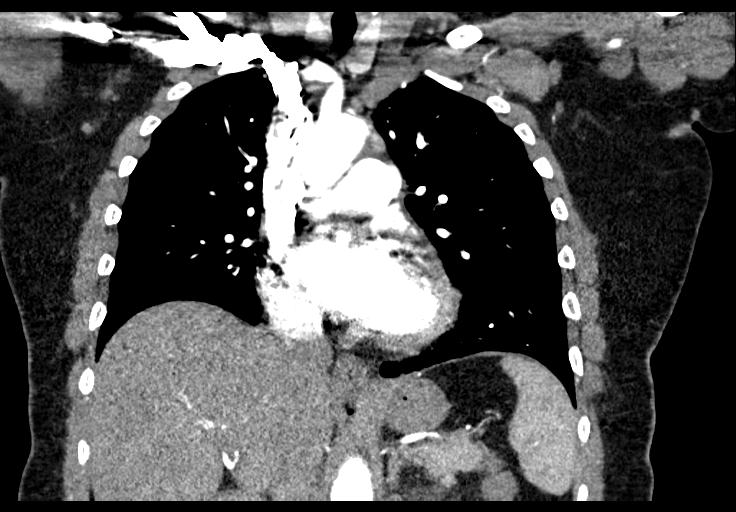

[18 of 36 positions shown; findings below may reference images not displayed]

FINDINGS: Cardiovascular: Satisfactory opacification of the bilateral
pulmonary arteries to the segmental level. No evidence of pulmonary
embolism.

No evidence of thoracic aortic aneurysm or dissection. Very mild
atherosclerotic calcifications of the aortic arch and descending
thoracic aorta.

The heart is normal in size.  No pericardial effusion.

Mediastinum/Nodes: Mild thoracic lymphadenopathy, including:

--13 mm short axis high right paratracheal node (series 6/image 18)

--11 mm short axis prevascular node (series 6/image 25)

--14 mm short axis low right paratracheal node (series 6/image 28)

--11 mm short axis right hilar node (series 6/image 31)

--12 mm short axis subcarinal node (series 6/image 31)

Visualized thyroid is unremarkable.

Lungs/Pleura: Mild centrilobular and paraseptal emphysematous
changes.

No focal consolidation. Mild dependent atelectasis in the posterior
right upper lobe and bilateral lower lobes.

No suspicious pulmonary nodules.

No pleural effusion or pneumothorax.

Upper Abdomen: Visualized upper abdomen is notable for a tiny hiatal
hernia but is otherwise unremarkable.. Spleen is normal in size.

Musculoskeletal: Mild degenerative changes of the mid thoracic
spine.

Review of the MIP images confirms the above findings.
IMPRESSION: No evidence of pulmonary embolism.

Mild thoracic lymphadenopathy, nonspecific. This appearance may be
reactive or related to a systemic process such as sarcoidosis.
However, follow-up CT chest with contrast is suggested in 3-6 months
to assess for progression.

Aortic Atherosclerosis (CFSYF-QUO.O) and Emphysema (CFSYF-X5F.W).
